# Patient Record
Sex: Male | Born: 1950
Health system: Southern US, Community
[De-identification: ages and names within clinical notes are randomized; demographics above are authoritative.]

## PROBLEM LIST (undated history)

## (undated) DIAGNOSIS — C439 Malignant melanoma of skin, unspecified: Secondary | ICD-10-CM

## (undated) DIAGNOSIS — Z8546 Personal history of malignant neoplasm of prostate: Secondary | ICD-10-CM

## (undated) DIAGNOSIS — Z9189 Other specified personal risk factors, not elsewhere classified: Secondary | ICD-10-CM

## (undated) DIAGNOSIS — C801 Malignant (primary) neoplasm, unspecified: Secondary | ICD-10-CM

## (undated) DIAGNOSIS — Z87442 Personal history of urinary calculi: Secondary | ICD-10-CM

## (undated) DIAGNOSIS — Z9889 Other specified postprocedural states: Secondary | ICD-10-CM

## (undated) DIAGNOSIS — Z85820 Personal history of malignant melanoma of skin: Secondary | ICD-10-CM

## (undated) DIAGNOSIS — E559 Vitamin D deficiency, unspecified: Secondary | ICD-10-CM

## (undated) DIAGNOSIS — Z973 Presence of spectacles and contact lenses: Secondary | ICD-10-CM

## (undated) DIAGNOSIS — I10 Essential (primary) hypertension: Secondary | ICD-10-CM

## (undated) DIAGNOSIS — N201 Calculus of ureter: Secondary | ICD-10-CM

---

## 1979-08-10 HISTORY — PX: WRIST GANGLION EXCISION: SUR520

## 1999-12-31 ENCOUNTER — Encounter: Admission: RE | Admit: 1999-12-31 | Discharge: 1999-12-31 | Payer: Self-pay | Admitting: Family Medicine

## 1999-12-31 ENCOUNTER — Encounter: Payer: Self-pay | Admitting: Family Medicine

## 2001-01-27 ENCOUNTER — Encounter: Payer: Self-pay | Admitting: Family Medicine

## 2001-01-27 ENCOUNTER — Encounter: Admission: RE | Admit: 2001-01-27 | Discharge: 2001-01-27 | Payer: Self-pay | Admitting: Family Medicine

## 2002-04-26 ENCOUNTER — Ambulatory Visit (HOSPITAL_COMMUNITY): Admission: RE | Admit: 2002-04-26 | Discharge: 2002-04-26 | Payer: Self-pay | Admitting: *Deleted

## 2003-02-01 ENCOUNTER — Encounter: Payer: Self-pay | Admitting: Family Medicine

## 2003-02-01 ENCOUNTER — Encounter: Admission: RE | Admit: 2003-02-01 | Discharge: 2003-02-01 | Payer: Self-pay | Admitting: Family Medicine

## 2008-02-23 ENCOUNTER — Encounter: Admission: RE | Admit: 2008-02-23 | Discharge: 2008-02-23 | Payer: Self-pay | Admitting: Family Medicine

## 2009-07-09 HISTORY — PX: ROBOT ASSISTED LAPAROSCOPIC RADICAL PROSTATECTOMY: SHX5141

## 2009-07-10 ENCOUNTER — Encounter (INDEPENDENT_AMBULATORY_CARE_PROVIDER_SITE_OTHER): Payer: Self-pay | Admitting: Urology

## 2009-07-10 ENCOUNTER — Inpatient Hospital Stay (HOSPITAL_COMMUNITY): Admission: RE | Admit: 2009-07-10 | Discharge: 2009-07-11 | Payer: Self-pay | Admitting: Urology

## 2010-03-19 ENCOUNTER — Encounter: Admission: RE | Admit: 2010-03-19 | Discharge: 2010-03-19 | Payer: Self-pay | Admitting: Family Medicine

## 2011-03-16 LAB — ABO/RH: ABO/RH(D): O NEG

## 2011-03-16 LAB — HEMOGLOBIN AND HEMATOCRIT, BLOOD
HCT: 45 % (ref 39.0–52.0)
Hemoglobin: 13.4 g/dL (ref 13.0–17.0)

## 2011-03-16 LAB — TYPE AND SCREEN
ABO/RH(D): O NEG
Antibody Screen: NEGATIVE

## 2011-03-17 LAB — COMPREHENSIVE METABOLIC PANEL
ALT: 29 U/L (ref 0–53)
AST: 31 U/L (ref 0–37)
Albumin: 4.3 g/dL (ref 3.5–5.2)
CO2: 28 mEq/L (ref 19–32)
Calcium: 9.5 mg/dL (ref 8.4–10.5)
Chloride: 106 mEq/L (ref 96–112)
GFR calc Af Amer: >60 mL/min (ref >60)
GFR calc non Af Amer: >60 mL/min (ref >60)
Sodium: 141 mEq/L (ref 135–145)

## 2011-03-17 LAB — CBC
MCHC: 34.5 g/dL (ref 30.0–36.0)
RBC: 5.01 MIL/uL (ref 4.22–5.81)
WBC: 4.9 10*3/uL (ref 4.0–10.5)

## 2011-04-23 NOTE — Op Note (Signed)
NAMESABIAN, Lawrence Finley               ACCOUNT NO.:  [id number]   MEDICAL RECORD NO.:  [id number]          PATIENT TYPE:  INP   LOCATION:  0002                         FACILITY:  [facility]   PHYSICIAN:  Valetta Fuller, M.D.  DATE OF BIRTH:  [birth year]   DATE OF PROCEDURE:  DATE OF DISCHARGE:                               OPERATIVE REPORT   PREOPERATIVE DIAGNOSIS:  Favorable clinical stage T1c adenocarcinoma of  the prostate.   POSTOPERATIVE DIAGNOSIS:  Favorable clinical stage T1c adenocarcinoma of  the prostate.   PROCEDURE PERFORMED:  Robotic-assisted laparoscopic radical retropubic  prostatectomy.   SURGEON:  Valetta Fuller, MD   ASSISTANT:  Delia Chimes, NP.   ANESTHESIA:  General endotracheal.   INDICATIONS:  Lawrence Finley is 60 years of age.  He was diagnosed by Dr.  Su Grand with favorable clinical stage T1c disease.  A moderate-volume  prostate without worrisome features.  Three out of 12 cores positive for  what appeared to be relatively low-volume Gleason 6 cancer involving the  left lobe of the prostate.  The patient underwent extensive consultation  with Dr. Brunilda Payor as well as myself with regard to treatment options and  elected to proceed with a surgical approach.  He chose robotic  laparoscopic prostatectomy.  He appeared to understand the advantages  and disadvantages of this approach.  We specifically discussed at length  issues with regard to incontinence and erectile dysfunction.  Full  informed consent has been obtained.  The patient went to preoperative  classes and physical therapy consultation.  He has done a mechanical  bowel prep.  Compression boots were placed preoperatively and the  patient received perioperative antibiotics.   TECHNIQUE AND FINDINGS:  The patient was brought to the operating room.  He was carefully placed in a lithotomy position, all extremities  carefully padded.  He had successful induction of general endotracheal  anesthesia.  He was  carefully secured to the operative table and then  placed in a steep Trendelenburg position.  He was then prepped and  draped in the usual manner.  A Foley catheter was inserted sterilely on  the field.   Initial camera port incision was chosen 18 cm above the pubic symphysis.  This was just to the left of the midline.  An open standard Hasson  technique was performed.  No adhesions were appreciated and abdominal  insufflation occurred without incident.  All other trocars placed with  direct visual guidance.  This included a 12-mm and 5-mm assist port and  three 8-mm robotic trocars.  Once all trocars were positioned, the  surgical cart was docked.  The bladder was filled to allow for easier  dissection in the space of Retzius.  The retropubic space was then  developed with a combination of electrocautery scissors and blunt  dissective technique.  Superficial fat off the endopelvic fascia and  bladder neck region was then taken down with electrocautery scissors.  The endopelvic fascia was opened from base to apex.  The levator  musculature was swept off the apex of the prostate isolating the dorsal  venous complex, which  was then stapled with the ETS stapler device.  Anterior bladder neck was identified with the aid of the Foley balloon  and then transected with electrocautery scissors.  Foley catheter was  found in the midline and retracted anteriorly.  Indigo carmine was given  but we were well away from the trigone and ureteral orifices.  Posterior  bladder neck was then transected.  Posteriorly the vas deferens and  seminal vesicles were individually dissected with the tips clipped  bilaterally.  The plane between the prostate and the rectum was then  established primarily with blunt dissective technique.   Attention was then turned towards bilateral nerve-spare.  Superficial  fascia on the prostate anteriorly was incised and then swept posterior  laterally on both sides.  The  neurovascular bundle was then swept from  apex back to base of the prostate so this was well off the lateral  aspect of the prostate.  By placing a retractor underneath the pedicle  of the prostate, the pedicle was divided into several bundles and  clipped with a locking clip.  This was done bilaterally.  At that point  only the apex and some very minimal posterior flimsy tissue remained.  Anterior urethra was then transected.  Foley catheter was retracted.  The posterior urethra was then transected.  A small amount of  rectourethralis muscle was then transected and the prostate specimen was  removed from the pelvis.  A copious irrigation was performed.  Hemostasis remained good other than minimal oozing from around the  levator sidewalls.  Dorsal vein was completely hemostatic at that  juncture.   We did not feel that there was an indication for pelvic lymph node  dissection given the relatively low-volume Gleason 6 disease.  Denonvilliers fascia was reapproximated with a 2-0 Vicryl suture to  provide additional support for the bladder.  The posterior bladder neck  and then posterior stump of the urethra were then reapproximated with an  interrupted 2-0 Vicryl suture.  Once these structures were  reapproximated, the rest of the anastomosis was done with a double-armed  3-0 Monocryl suture in a 360 running manner.  A new Coude catheter was  placed without difficulty and irrigation revealed the anastomosis to be  watertight.  We did place some FloSeal along the area around the bladder  neck and into the levator fossa to help with just a small amount of  oozing.  Blood loss at that point was in the 200-390 cc range.   A drain was placed through one of the 8-mm robotic trocars and placed in  the retropubic space and then secured to the skin.  The prostate  specimen was placed an Endopouch retrieval bag.  The 12-mm trocar site  was closed with a Vicryl suture with the aid of a suture  passer.  All  other trocars were taken down with direct visual guidance.  The camera  port incision was extended slightly to allow for removal of the prostate  specimen and that fascia was then closed with a running #1 Vicryl  suture.  All incisions were infiltrated and then closed with clips.  The  bladder irrigated a light-blue-colored urine.  No obvious complications  or problems occurred.  Again, estimated blood loss was in the 200-300 cc  range.  The patient was brought to the recovery room in stable  condition.      Valetta Fuller, M.D.  Electronically Signed     DSG/MEDQ  D:  07/10/2009  T:  07/10/2009  Job:  [id number]

## 2012-02-10 ENCOUNTER — Other Ambulatory Visit: Payer: Self-pay | Admitting: Family Medicine

## 2012-03-24 ENCOUNTER — Ambulatory Visit
Admission: RE | Admit: 2012-03-24 | Discharge: 2012-03-24 | Disposition: A | Discharge status: Home / Self Care | Payer: 59 | Source: Ambulatory Visit | Attending: Family Medicine | Admitting: Family Medicine

## 2012-03-24 DIAGNOSIS — M8580 Other specified disorders of bone density and structure, unspecified site: Secondary | ICD-10-CM

## 2012-04-09 ENCOUNTER — Other Ambulatory Visit: Payer: Self-pay | Admitting: Gastroenterology

## 2014-09-07 ENCOUNTER — Other Ambulatory Visit: Payer: Self-pay | Admitting: Family Medicine

## 2014-09-07 ENCOUNTER — Ambulatory Visit
Admission: RE | Admit: 2014-09-07 | Discharge: 2014-09-07 | Disposition: A | Discharge status: Home / Self Care | Payer: 59 | Source: Ambulatory Visit | Attending: Family Medicine | Admitting: Family Medicine

## 2014-09-07 DIAGNOSIS — N5082 Scrotal pain: Secondary | ICD-10-CM

## 2014-09-07 DIAGNOSIS — R319 Hematuria, unspecified: Secondary | ICD-10-CM

## 2014-09-07 DIAGNOSIS — R1031 Right lower quadrant pain: Secondary | ICD-10-CM

## 2014-10-07 ENCOUNTER — Other Ambulatory Visit: Payer: Self-pay | Admitting: Urology

## 2014-10-07 ENCOUNTER — Encounter (HOSPITAL_COMMUNITY): Payer: Self-pay | Admitting: Pharmacy Technician

## 2014-10-09 DIAGNOSIS — Z87442 Personal history of urinary calculi: Secondary | ICD-10-CM

## 2014-10-09 HISTORY — DX: Personal history of urinary calculi: Z87.442

## 2014-10-11 ENCOUNTER — Encounter (HOSPITAL_COMMUNITY): Payer: Self-pay | Admitting: *Deleted

## 2014-10-16 NOTE — H&P (Addendum)
Reason For Visit Seen today as a referral from Dr. Oneida Arenas for evaluation of gross hematuria and 6 X 9 mm (R) UPJ calculus.   Active Problems Problems  1. Calculus of right ureter (N20.1)   Assessed By: Jimmey Ralph (Urology); Last Assessed: 05 Oct 2014 2. Gross hematuria (R31.0)   Assessed By: Jimmey Ralph (Urology); Last Assessed: 05 Oct 2014  History of Present Illness 63 YO male patient of Dr. Cy Blamer seen today as a referral from Dr. Oneida Arenas for evaluation of gross hematuria and 6 X 9 mm (R) UPJ calculus.     GU Hx:  5 years status post his robotic prostatectomy( 07/2009). He had very favorable pathology with low-volume Gleason 6 cancer and negative margins. Initial PSA and subsequent PSAs have all continued to be undetectable. His incontinence has resolved ( 99%). He has no other voiding issues and seems to be satisfied with this progress in that area. He did have normal preoperative erectile functioning.      He is able to have intercourse without any devices or oral agents but erections are not 100%. No SUI. Oral agents cause headaches.  Erections without meds are 80-90%.  Erections continue to improve.    Oct 2015 Interval Hx:   Today continues to have painless intermittent gross hematuria. Denies passing stone. Was not started on alpha blocker.   Past Medical History Problems  1. History of hypertension (Z86.79) 2. History of Malignant Melanoma Of The Skin  Surgical History Problems  1. History of Prostatectomy Robotic-Assisted 2. History of Surgery Of Male Genitalia Vasectomy 3. History of Wrist Excision Of Ganglion  Current Meds 1. AmLODIPine Besylate 10 MG Oral Tablet; Take 1 tablet daily;  Therapy: (Recorded:08Mar2012) to Recorded 2. Aspirin 81 MG Oral Tablet; 1 per day;  Therapy: (Recorded:08Mar2012) to Recorded 3. Vitamin D TABS; 50,000mg ; 1 per week;  Therapy: (Recorded:08Mar2012) to Recorded  Allergies No Known Allergies  1. No Known  Allergies  Family History Problems  1. Family history of Family Health Status - Father's Age : Mother   77 2. Family history of Family Health Status - Mother's Age : Mother   93 3. Family history of Family Health Status Number Of Children : Mother   2 sons 4. Family history of Heart Disease : Father 5. Family history of Prostate Cancer  Social History Problems  1. Activities Of Daily Living 2. Alcohol Use   2 per wk 3. Caffeine Use   tea 2 per day 4. Exercise Habits   Yoga on a daily basis 5. Living Independently With Spouse 6. Marital History - Currently Married 7. Never A Smoker 8. Self-reliant In Usual Daily Activities  Review of Systems Genitourinary, constitutional, skin, eye, otolaryngeal, hematologic/lymphatic, cardiovascular, pulmonary, endocrine, musculoskeletal, gastrointestinal, neurological and psychiatric system(s) were reviewed and pertinent findings if present are noted.  Genitourinary: hematuria.    Vitals Vital Signs [Data Includes: Last 1 Day]  Recorded: 28Oct2015 09:55AM  Height: 6 ft  Weight: 210 lb  BMI Calculated: 28.48 BSA Calculated: 2.18 Blood Pressure: 135 / 80 Temperature: 98.4 F Heart Rate: 63  Physical Exam Constitutional: Well nourished and well developed . The patient appears well hydrated.  Abdomen: The abdomen is flat. The abdomen is soft and nontender. No suprapubic tenderness. No CVA tenderness.  Skin: Normal skin turgor and normal skin color and pigmentation.  Neuro/Psych:. Mood and affect are appropriate.    Results/Data Urine [Data Includes: Last 1 Day]   28Oct2015  COLOR AMBER   APPEARANCE  CLOUDY   SPECIFIC GRAVITY 1.025   pH 5.5   GLUCOSE NEG mg/dL  BILIRUBIN SMALL   KETONE NEG mg/dL  BLOOD LARGE   PROTEIN 30 mg/dL  UROBILINOGEN 0.2 mg/dL  NITRITE NEG   LEUKOCYTE ESTERASE NEG   SQUAMOUS EPITHELIAL/HPF FEW   WBC 0-2 WBC/hpf  RBC TNTC RBC/hpf  BACTERIA FEW   CRYSTALS Calcium Oxalate crystals noted   CASTS  Hyaline casts noted   Other MUCUS NOTED    Old records or history reviewed: Reviewed OV notes and CT abd/pelvis w/o contrast.  The following images/tracing/specimen were independently visualized:  KUB: clearly shows large right UPJ calculus.  The following clinical lab reports were reviewed:  UA- significant microscopic hematuria RBC: TNTC.    Assessment Assessed  1. Calculus of right ureter (N20.1) 2. Gross hematuria (R31.0)  Plan  Health Maintenance  1. UA With REFLEX; [Do Not Release]; Status:Complete;   Done: 28Oct2015 09:49AM  Given strainer. Instructed to strain urine. If captured bring to office  Has hydrocodone on hand  Begin Tamsulosin 0.4 mg 1 po daily #30/0RF  Will have Dr. Risa Grill call pt to discuss treatment option of either (R) EWSL vs cystourethroscopy, R RPG, stone extraction         KUB; Status:Resulted - Requires Verification;  Done: [id number] 12:00AM  Due:30Oct2015; Marked Important;Ordered; Today;   TJQ:ZESPQZRA of right ureter, Gross hematuria; Ordered QT:MAUQJF, Diane;   Discussion/Summary cc: Dr. Oneida Arenas   Signatures Electronically signed by : Jimmey Ralph, Trey Paula; Oct 05 2014 10:45AM EST  I reviewed Mr. Moch images and recent notes. I contacted him by phone and discussed his situation. We collectively elected to proceed with ESWL. Risks benefits of that procedure discussed with him.

## 2014-10-17 ENCOUNTER — Ambulatory Visit (HOSPITAL_COMMUNITY): Payer: 59

## 2014-10-17 ENCOUNTER — Encounter (HOSPITAL_COMMUNITY): Payer: Self-pay | Admitting: *Deleted

## 2014-10-17 ENCOUNTER — Ambulatory Visit (HOSPITAL_COMMUNITY)
Admission: RE | Admit: 2014-10-17 | Discharge: 2014-10-17 | Disposition: A | Discharge status: Home / Self Care | Payer: 59 | Source: Ambulatory Visit | Attending: Urology | Admitting: Urology

## 2014-10-17 ENCOUNTER — Encounter (HOSPITAL_COMMUNITY)
Admission: RE | Disposition: A | Discharge status: Home / Self Care | Payer: Self-pay | Source: Ambulatory Visit | Attending: Urology

## 2014-10-17 DIAGNOSIS — N201 Calculus of ureter: Secondary | ICD-10-CM

## 2014-10-17 DIAGNOSIS — I10 Essential (primary) hypertension: Secondary | ICD-10-CM | POA: Insufficient documentation

## 2014-10-17 DIAGNOSIS — Z8546 Personal history of malignant neoplasm of prostate: Secondary | ICD-10-CM | POA: Diagnosis not present

## 2014-10-17 DIAGNOSIS — Z85820 Personal history of malignant melanoma of skin: Secondary | ICD-10-CM | POA: Insufficient documentation

## 2014-10-17 DIAGNOSIS — Z7982 Long term (current) use of aspirin: Secondary | ICD-10-CM | POA: Diagnosis not present

## 2014-10-17 HISTORY — PX: EXTRACORPOREAL SHOCK WAVE LITHOTRIPSY: SHX1557

## 2014-10-17 HISTORY — DX: Personal history of urinary calculi: Z87.442

## 2014-10-17 HISTORY — DX: Essential (primary) hypertension: I10

## 2014-10-17 SURGERY — LITHOTRIPSY, ESWL
Anesthesia: LOCAL | Laterality: Right

## 2014-10-17 MED ORDER — DIAZEPAM 5 MG PO TABS
10.0000 mg | ORAL_TABLET | ORAL | Status: AC
  Administered 2014-10-17: 10 mg via ORAL
  Filled 2014-10-17: qty 2

## 2014-10-17 MED ORDER — CIPROFLOXACIN HCL 500 MG PO TABS
500.0000 mg | ORAL_TABLET | ORAL | Status: AC
  Administered 2014-10-17: 500 mg via ORAL
  Filled 2014-10-17: qty 1

## 2014-10-17 MED ORDER — DIPHENHYDRAMINE HCL 25 MG PO CAPS
25.0000 mg | ORAL_CAPSULE | ORAL | Status: AC
  Administered 2014-10-17: 25 mg via ORAL
  Filled 2014-10-17: qty 1

## 2014-10-17 MED ORDER — DEXTROSE-NACL 5-0.45 % IV SOLN
INTRAVENOUS | Status: DC
  Administered 2014-10-17: 09:00:00 via INTRAVENOUS

## 2014-10-17 NOTE — Interval H&P Note (Signed)
History and Physical Interval Note:  10/17/2014 10:10 AM  Lawrence Finley  has presented today for surgery, with the diagnosis of Right Ureteral Pelvic Junction Stone  The various methods of treatment have been discussed with the patient and family. After consideration of risks, benefits and other options for treatment, the patient has consented to  Procedure(s): RIGHT EXTRACORPOREAL SHOCK WAVE LITHOTRIPSY (ESWL) (Right) as a surgical intervention .  The patient's history has been reviewed, patient examined, no change in status, stable for surgery.  I have reviewed the patient's chart and labs.  Questions were answered to the patient's satisfaction.     Ladan Vanderzanden S

## 2014-10-17 NOTE — Discharge Instructions (Signed)
See [facility] discharge instructions in chart.  

## 2014-10-17 NOTE — Op Note (Signed)
See [facility] OP note scanned into chart. 

## 2014-12-19 ENCOUNTER — Other Ambulatory Visit: Payer: Self-pay | Admitting: Urology

## 2014-12-21 ENCOUNTER — Encounter (HOSPITAL_BASED_OUTPATIENT_CLINIC_OR_DEPARTMENT_OTHER): Payer: Self-pay | Admitting: *Deleted

## 2014-12-21 NOTE — Progress Notes (Signed)
   12/21/14 1557  OBSTRUCTIVE SLEEP APNEA  Have you ever been diagnosed with sleep apnea through a sleep study? No  Do you snore loudly (loud enough to be heard through closed doors)?  1  Do you often feel tired, fatigued, or sleepy during the daytime? 0  Has anyone observed you stop breathing during your sleep? 1  Do you have, or are you being treated for high blood pressure? 1  BMI more than 35 kg/m2? 0  Age over 64 years old? 1  Neck circumference greater than 40 cm/16 inches? 1  Gender: 1  Obstructive Sleep Apnea Score 6  Score 4 or greater  Results sent to PCP

## 2014-12-21 NOTE — Progress Notes (Signed)
SPOKE W/ PT WIFE. NPO AFTER MN. ARRIVE AT 0600. NEEDS ISTAT AND EKG.

## 2014-12-23 NOTE — H&P (Signed)
Reason For Visit   [name] presents today for additional follow-up status post his ESWL. Prostate 2 months ago he underwent treatment for a dense fairly large stone in his right ureteropelvic junction. The stone fragmented but poorly. He passed 4-6 fragments but on follow-up KUB on month ago he had what appeared to 2 fairly large fragments within the proximal ureter on the right. Both these fragments were about 5 mm in size. He has not had much in the way of discomfort since that time. He has not had any gross hematuria. He has had no fever or chills. He has no new complaints today. He is here for further assessment of his current stone status.    A limited right renal ultrasonography there is no gross evidence of hydronephrosis in the right kidney. There is no other pathology appreciated. No evidence of renal hematoma. No obvious solid right renal mass. On KUB imaging the patient continues to have evidence of 2-3 fragments within the proximal to mid ureter on the right. There is been slight progression distally but no more than a centimeter or 2. Fragments continue to be about 5 mm in size. Urinalysis today shows nothing of concern. From a prostate cancer standpoint he continues to do well. His last undetectable PSA was approximately 6 months ago.       History of Present Illness            Past Gu Hx:  Mr. Lawrence Finley is now 5 years status post his robotic prostatectomy( 07/2009). He had very favorable pathology with low-volume Gleason 6 cancer and negative margins. Initial PSA and subsequent PSAs have all continued to be undetectable. His incontinence has resolved ( 99%). He has no other voiding issues and seems to be satisfied with this progress in that area. He did have normal preoperative erectile functioning.      He is able to have intercourse without any devices or oral agents but erections are not 100%. No SUI. Oral agents cause headaches.  Erections without meds are 80-90%.   Erections continue to improve. He has no new complaints or concerns today   Past Medical History Problems  1. History of hypertension (Z86.79) 2. History of Malignant Melanoma Of The Skin  Surgical History Problems  1. History of Lithotripsy 2. History of Prostatectomy Robotic-Assisted 3. History of Surgery Of Male Genitalia Vasectomy 4. History of Wrist Excision Of Ganglion  Current Meds 1. AmLODIPine Besylate 10 MG Oral Tablet; Take 1 tablet daily;  Therapy: (Recorded:08Mar2012) to Recorded 2. Aspirin 81 MG Oral Tablet; 1 per day;  Therapy: (Recorded:08Mar2012) to Recorded 3. Hydrocodone-Acetaminophen TABS;  Therapy: (Recorded:28Oct2015) to Recorded 4. Tamsulosin HCl - 0.4 MG Oral Capsule; Take one (1) tablet(s) every night at bedtime;  Therapy: 28Oct2015 to (Evaluate:27Nov2016)  Requested for: [id number]; Last  Rx:03Dec2015 Ordered 5. Vitamin D TABS; 50,000mg ; 1 per week;  Therapy: (Recorded:08Mar2012) to Recorded  Allergies Medication  1. No Known Drug Allergies  Family History Problems  1. Family history of Family Health Status - Father's Age : Mother   63 2. Family history of Family Health Status - Mother's Age : Mother   53 3. Family history of Family Health Status Number Of Children : Mother   2 sons 4. Family history of Heart Disease : Father 5. Family history of Prostate Cancer  Social History Problems  1. Activities Of Daily Living 2. Alcohol Use   2 per wk 3. Caffeine Use   tea 2 per day 4. Exercise Habits  Yoga on a daily basis 5. Living Independently With Spouse 6. Marital History - Currently Married 7. Never A Smoker 8. Self-reliant In Usual Daily Activities  Review of Systems  Genitourinary: no incontinence, urine stream is not weak, no hematuria, no penile pain and no penile curvature.  Gastrointestinal: no flank pain and no abdominal pain.    Vitals Vital Signs [Data Includes: Last 1 Day]  Recorded: [id number] 03:01PM  Blood  Pressure: 142 / 83 Temperature: 97.5 F Heart Rate: 65  Physical Exam Constitutional: Well nourished and well developed . The patient appears well hydrated. Chest: Nl effort  Abdomen: The abdomen is flat. The abdomen is soft and nontender. No suprapubic tenderness. No CVA tenderness.  Skin: Normal skin turgor and normal skin color and pigmentation.  Neuro/Psych:. Mood and affect are appropriate. Results/Data Urine [Data Includes: Last 1 Day]   [id number] COLOR YELLOW  APPEARANCE CLEAR  SPECIFIC GRAVITY 1.030  pH 6.0  GLUCOSE NEG mg/dL BILIRUBIN NEG  KETONE TRACE mg/dL BLOOD TRACE  PROTEIN NEG mg/dL UROBILINOGEN 0.2 mg/dL NITRITE NEG  LEUKOCYTE ESTERASE NEG  SQUAMOUS EPITHELIAL/HPF RARE  WBC NONE SEEN WBC/hpf RBC 0-2 RBC/hpf BACTERIA RARE  CRYSTALS NONE SEEN  CASTS NONE SEEN  Other MUCUS   Assessment Assessed  1. Calculus of right ureter (N20.1)  Plan Calculus of right ureter  1. Follow-up Schedule Surgery Office  Follow-up  Status: Complete  Done: [id number] Health Maintenance  2. UA With REFLEX; [Do Not Release]; Status:Complete;   Done: [id number] 02:37PM  Discussion/Summary Lawrence Finley is now 2 months status post ESWL. He continues to have fragments within his right mid ureter. We talked about several options. This included ongoing observation versus repeat ESWL versus ureteroscopy. After discussion of the risks and benefits of all these approaches he has elected to proceed with ureteroscopy with holmium laser lithotripsy. Risk and benefits of procedure discussed with him in detail. This will be set up for sometime in the near future.   Signatures Electronically signed by : Rana Snare, M.D.; Dec 19 2014  5:29PM EST

## 2014-12-25 NOTE — Anesthesia Preprocedure Evaluation (Addendum)
Anesthesia Evaluation  Patient identified by MRN, date of birth, ID band Patient awake    Reviewed: Allergy & Precautions, H&P , NPO status , Patient's Chart, lab work & pertinent test results  Airway Mallampati: II  TM Distance: >3 FB Neck ROM: full    Dental no notable dental hx. (+) Teeth Intact, Dental Advisory Given   Pulmonary  Stop bang 6 breath sounds clear to auscultation  Pulmonary exam normal       Cardiovascular Exercise Tolerance: Good hypertension, Pt. on medications negative cardio ROS  Rhythm:regular Rate:Normal     Neuro/Psych negative neurological ROS  negative psych ROS   GI/Hepatic negative GI ROS, Neg liver ROS,   Endo/Other  negative endocrine ROS  Renal/GU negative Renal ROS  negative genitourinary   Musculoskeletal   Abdominal   Peds  Hematology negative hematology ROS (+)   Anesthesia Other Findings Prostate cancer  Reproductive/Obstetrics negative OB ROS                            Anesthesia Physical Anesthesia Plan  ASA: III  Anesthesia Plan: General   Post-op Pain Management:    Induction: Intravenous  Airway Management Planned: LMA  Additional Equipment:   Intra-op Plan:   Post-operative Plan:   Informed Consent: I have reviewed the patients History and Physical, chart, labs and discussed the procedure including the risks, benefits and alternatives for the proposed anesthesia with the patient or authorized representative who has indicated his/her understanding and acceptance.   Dental Advisory Given  Plan Discussed with: CRNA and Surgeon  Anesthesia Plan Comments:         Anesthesia Quick Evaluation

## 2014-12-26 ENCOUNTER — Ambulatory Visit (HOSPITAL_BASED_OUTPATIENT_CLINIC_OR_DEPARTMENT_OTHER): Payer: 59 | Admitting: Anesthesiology

## 2014-12-26 ENCOUNTER — Encounter (HOSPITAL_BASED_OUTPATIENT_CLINIC_OR_DEPARTMENT_OTHER)
Admission: RE | Disposition: A | Discharge status: Home / Self Care | Payer: Self-pay | Source: Ambulatory Visit | Attending: Urology

## 2014-12-26 ENCOUNTER — Ambulatory Visit (HOSPITAL_BASED_OUTPATIENT_CLINIC_OR_DEPARTMENT_OTHER)
Admission: RE | Admit: 2014-12-26 | Discharge: 2014-12-26 | Disposition: A | Discharge status: Home / Self Care | Payer: 59 | Source: Ambulatory Visit | Attending: Urology | Admitting: Urology

## 2014-12-26 ENCOUNTER — Encounter (HOSPITAL_BASED_OUTPATIENT_CLINIC_OR_DEPARTMENT_OTHER): Payer: Self-pay | Admitting: *Deleted

## 2014-12-26 ENCOUNTER — Other Ambulatory Visit: Payer: Self-pay

## 2014-12-26 DIAGNOSIS — I10 Essential (primary) hypertension: Secondary | ICD-10-CM | POA: Diagnosis not present

## 2014-12-26 DIAGNOSIS — Z7982 Long term (current) use of aspirin: Secondary | ICD-10-CM | POA: Diagnosis not present

## 2014-12-26 DIAGNOSIS — Z85820 Personal history of malignant melanoma of skin: Secondary | ICD-10-CM | POA: Insufficient documentation

## 2014-12-26 DIAGNOSIS — Z8546 Personal history of malignant neoplasm of prostate: Secondary | ICD-10-CM | POA: Insufficient documentation

## 2014-12-26 DIAGNOSIS — N201 Calculus of ureter: Secondary | ICD-10-CM | POA: Diagnosis present

## 2014-12-26 HISTORY — DX: Other specified postprocedural states: Z98.890

## 2014-12-26 HISTORY — DX: Vitamin D deficiency, unspecified: E55.9

## 2014-12-26 HISTORY — DX: Calculus of ureter: N20.1

## 2014-12-26 HISTORY — PX: CYSTOSCOPY WITH RETROGRADE PYELOGRAM, URETEROSCOPY AND STENT PLACEMENT: SHX5789

## 2014-12-26 HISTORY — DX: Other specified personal risk factors, not elsewhere classified: Z91.89

## 2014-12-26 HISTORY — DX: Personal history of malignant neoplasm of prostate: Z85.46

## 2014-12-26 HISTORY — DX: Other specified postprocedural states: Z85.820

## 2014-12-26 HISTORY — DX: Presence of spectacles and contact lenses: Z97.3

## 2014-12-26 HISTORY — PX: HOLMIUM LASER APPLICATION: SHX5852

## 2014-12-26 LAB — POCT I-STAT 4, (NA,K, GLUC, HGB,HCT)
GLUCOSE: 105 mg/dL — AB (ref 70–99)
HEMATOCRIT: 47 % (ref 39.0–52.0)
Hemoglobin: 16 g/dL (ref 13.0–17.0)
Potassium: 3.5 mmol/L (ref 3.5–5.1)
Sodium: 142 mmol/L (ref 135–145)

## 2014-12-26 SURGERY — CYSTOURETEROSCOPY, WITH RETROGRADE PYELOGRAM AND STENT INSERTION
Anesthesia: General | Site: Ureter | Laterality: Right

## 2014-12-26 MED ORDER — CEFAZOLIN SODIUM-DEXTROSE 2-3 GM-% IV SOLR
INTRAVENOUS | Status: AC
  Filled 2014-12-26: qty 50

## 2014-12-26 MED ORDER — DEXAMETHASONE SODIUM PHOSPHATE 4 MG/ML IJ SOLN
INTRAMUSCULAR | Status: DC | PRN
  Administered 2014-12-26 (×2): 10 mg via INTRAVENOUS

## 2014-12-26 MED ORDER — PROPOFOL 10 MG/ML IV BOLUS
INTRAVENOUS | Status: DC | PRN
  Administered 2014-12-26: 200 mg via INTRAVENOUS

## 2014-12-26 MED ORDER — CEFAZOLIN SODIUM-DEXTROSE 2-3 GM-% IV SOLR
2.0000 g | INTRAVENOUS | Status: AC
  Administered 2014-12-26: 2 g via INTRAVENOUS
  Filled 2014-12-26: qty 50

## 2014-12-26 MED ORDER — ACETAMINOPHEN 10 MG/ML IV SOLN
INTRAVENOUS | Status: DC | PRN
  Administered 2014-12-26: 1000 mg via INTRAVENOUS

## 2014-12-26 MED ORDER — ONDANSETRON HCL 4 MG/2ML IJ SOLN
INTRAMUSCULAR | Status: DC | PRN
  Administered 2014-12-26: 4 mg via INTRAVENOUS

## 2014-12-26 MED ORDER — LACTATED RINGERS IV SOLN
INTRAVENOUS | Status: DC
  Administered 2014-12-26 (×2): via INTRAVENOUS
  Filled 2014-12-26: qty 1000

## 2014-12-26 MED ORDER — MIDAZOLAM HCL 2 MG/2ML IJ SOLN
INTRAMUSCULAR | Status: AC
  Filled 2014-12-26: qty 2

## 2014-12-26 MED ORDER — LIDOCAINE HCL (CARDIAC) 20 MG/ML IV SOLN
INTRAVENOUS | Status: DC | PRN
  Administered 2014-12-26: 100 mg via INTRAVENOUS

## 2014-12-26 MED ORDER — MIDAZOLAM HCL 5 MG/5ML IJ SOLN
INTRAMUSCULAR | Status: DC | PRN
  Administered 2014-12-26: 2 mg via INTRAVENOUS

## 2014-12-26 MED ORDER — BELLADONNA ALKALOIDS-OPIUM 16.2-60 MG RE SUPP
RECTAL | Status: AC
  Filled 2014-12-26: qty 1

## 2014-12-26 MED ORDER — BELLADONNA ALKALOIDS-OPIUM 16.2-60 MG RE SUPP
RECTAL | Status: DC | PRN
  Administered 2014-12-26: 1 via RECTAL

## 2014-12-26 MED ORDER — IOHEXOL 350 MG/ML SOLN
INTRAVENOUS | Status: DC | PRN
  Administered 2014-12-26: 2 mL

## 2014-12-26 MED ORDER — FENTANYL CITRATE 0.05 MG/ML IJ SOLN
INTRAMUSCULAR | Status: DC | PRN
  Administered 2014-12-26 (×2): 25 ug via INTRAVENOUS
  Administered 2014-12-26 (×2): 50 ug via INTRAVENOUS

## 2014-12-26 MED ORDER — EPHEDRINE SULFATE 50 MG/ML IJ SOLN
INTRAMUSCULAR | Status: DC | PRN
  Administered 2014-12-26 (×2): 10 mg via INTRAVENOUS

## 2014-12-26 MED ORDER — LIDOCAINE HCL 2 % EX GEL
CUTANEOUS | Status: DC | PRN
  Administered 2014-12-26: 1 via URETHRAL

## 2014-12-26 MED ORDER — FENTANYL CITRATE 0.05 MG/ML IJ SOLN
INTRAMUSCULAR | Status: AC
  Filled 2014-12-26: qty 4

## 2014-12-26 MED ORDER — URIBEL 118 MG PO CAPS: 1.0000 | ORAL_CAPSULE | Freq: Three times a day (TID) | ORAL | Status: DC | PRN

## 2014-12-26 MED ORDER — SODIUM CHLORIDE 0.9 % IR SOLN
Status: DC | PRN
  Administered 2014-12-26: 4000 mL via INTRAVESICAL

## 2014-12-26 MED ORDER — KETOROLAC TROMETHAMINE 30 MG/ML IJ SOLN
INTRAMUSCULAR | Status: DC | PRN
  Administered 2014-12-26: 30 mg via INTRAVENOUS

## 2014-12-26 SURGICAL SUPPLY — 39 items
ADAPTER CATH URET PLST 4-6FR (CATHETERS) IMPLANT
BAG DRAIN URO-CYSTO SKYTR STRL (DRAIN) ×2 IMPLANT
BASKET LASER NITINOL 1.9FR (BASKET) IMPLANT
BASKET STNLS GEMINI 4WIRE 3FR (BASKET) IMPLANT
BASKET ZERO TIP NITINOL 2.4FR (BASKET) ×2 IMPLANT
BENZOIN TINCTURE PRP APPL 2/3 (GAUZE/BANDAGES/DRESSINGS) IMPLANT
CANISTER SUCT LVC 12 LTR MEDI- (MISCELLANEOUS) ×2 IMPLANT
CATH INTERMIT  6FR 70CM (CATHETERS) IMPLANT
CATH URET 5FR 28IN CONE TIP (BALLOONS)
CATH URET 5FR 28IN OPEN ENDED (CATHETERS) IMPLANT
CATH URET 5FR 70CM CONE TIP (BALLOONS) IMPLANT
CLOTH BEACON ORANGE TIMEOUT ST (SAFETY) ×2 IMPLANT
DRAPE CAMERA CLOSED 9X96 (DRAPES) ×2 IMPLANT
DRSG TEGADERM 2-3/8X2-3/4 SM (GAUZE/BANDAGES/DRESSINGS) IMPLANT
FIBER LASER FLEXIVA 1000 (UROLOGICAL SUPPLIES) IMPLANT
FIBER LASER FLEXIVA 200 (UROLOGICAL SUPPLIES) IMPLANT
FIBER LASER FLEXIVA 365 (UROLOGICAL SUPPLIES) ×2 IMPLANT
FIBER LASER FLEXIVA 550 (UROLOGICAL SUPPLIES) IMPLANT
GLOVE BIO SURGEON STRL SZ 6.5 (GLOVE) ×2 IMPLANT
GLOVE BIO SURGEON STRL SZ7.5 (GLOVE) ×2 IMPLANT
GLOVE BIOGEL PI IND STRL 6.5 (GLOVE) ×2 IMPLANT
GLOVE BIOGEL PI INDICATOR 6.5 (GLOVE) ×2
GOWN STRL REIN XL XLG (GOWN DISPOSABLE) IMPLANT
GOWN STRL REUS W/TWL LRG LVL3 (GOWN DISPOSABLE) ×2 IMPLANT
GOWN STRL REUS W/TWL XL LVL3 (GOWN DISPOSABLE) ×2 IMPLANT
GUIDEWIRE 0.038 PTFE COATED (WIRE) IMPLANT
GUIDEWIRE ANG ZIPWIRE 038X150 (WIRE) IMPLANT
GUIDEWIRE STR DUAL SENSOR (WIRE) IMPLANT
IV NS 1000ML (IV SOLUTION) ×1
IV NS 1000ML BAXH (IV SOLUTION) ×1 IMPLANT
IV NS IRRIG 3000ML ARTHROMATIC (IV SOLUTION) ×2 IMPLANT
KIT BALLIN UROMAX 15FX10 (LABEL) IMPLANT
KIT BALLN UROMAX 15FX4 (MISCELLANEOUS) IMPLANT
KIT BALLN UROMAX 26 75X4 (MISCELLANEOUS)
NS IRRIG 500ML POUR BTL (IV SOLUTION) IMPLANT
PACK CYSTO (CUSTOM PROCEDURE TRAY) ×2 IMPLANT
SET HIGH PRES BAL DIL (LABEL)
SHEATH ACCESS URETERAL 38CM (SHEATH) IMPLANT
STENT URET 6FRX26 CONTOUR (STENTS) ×2 IMPLANT

## 2014-12-26 NOTE — Discharge Instructions (Addendum)

## 2014-12-26 NOTE — Op Note (Signed)
Preoperative diagnosis: right ureteral calculi  Postoperative diagnosis: right ureteral calculi  Procedure:  1. Cystoscopy 2. right ureteroscopy and stone removal 3. Ureteroscopic laser lithotripsy 4. right ureteral stent placement (26cm) 73F with string 5. right retrograde pyelography with interpretation  Surgeon: Bernestine Amass, MD  Anesthesia: General  Complications: None  Intraoperative findings: right retrograde pyelography demonstrated  filling defects within the right ureter consistent with the patient's known calculuiwithout other abnormalities.  EBL: Minimal  Specimens: 1. right ureteral calculi  Disposition of specimens: [facility] for stone analysis  Indication: Lawrence Finley is a 64 y.o.   patient with urolithiasis. After reviewing the management options for treatment, the patient elected to proceed with the above surgical procedure(s). We have discussed the potential benefits and risks of the procedure, side effects of the proposed treatment, the likelihood of the patient achieving the goals of the procedure, and any potential problems that might occur during the procedure or recuperation. Informed consent has been obtained.  Description of procedure:  The patient was taken to the operating room and general anesthesia was induced.  The patient was placed in the dorsal lithotomy position, prepped and draped in the usual sterile fashion, and preoperative antibiotics were administered. A preoperative time-out was performed.   Cystourethroscopy was performed.  The patient's urethra was examined and was normal. Prostatic urethra absent status post prostatectomy. The bladder was then systematically examined in its entirety. There was no evidence for any bladder tumors, stones, or other mucosal pathology.    Attention then turned to the right ureteral orifice and a ureteral catheter was used to intubate the ureteral orifice.  Omnipaque contrast was  injected through the ureteral catheter and a retrograde pyelogram was performed with findings as dictated above.  A 0.38 sensor guidewire was then advanced up the right ureter into the renal pelvis under fluoroscopic guidance. The 6 Fr semirigid ureteroscope was then advanced into the ureter next to the guidewire and the calculus was identified.   The stone was then fragmented with the 365 micron holmium laser fiber on a setting of 0.8J and frequency of 5 Hz.   All stones were then removed from the ureter with a zero tip nitinol basket.  Reinspection of the ureter revealed no remaining visible stones or fragments.   The wire was then backloaded through the cystoscope and a ureteral stent was advance over the wire using Seldinger technique.  The stent was positioned appropriately under fluoroscopic and cystoscopic guidance.  The wire was then removed with an adequate stent curl noted in the renal pelvis as well as in the bladder.  The bladder was then emptied and the procedure ended.  The patient appeared to tolerate the procedure well and without complications.  The patient was able to be awakened and transferred to the recovery unit in satisfactory condition.

## 2014-12-26 NOTE — Anesthesia Postprocedure Evaluation (Signed)
  Anesthesia Post-op Note  Patient: Lawrence Finley  Procedure(s) Performed: Procedure(s) (LRB): CYSTOSCOPY WITH RETROGRADE PYELOGRAM, URETEROSCOPY, STONE EXTRACTION,  AND STENT PLACEMENT (Right) HOLMIUM LASER APPLICATION (Right)  Patient Location: PACU  Anesthesia Type: General  Level of Consciousness: awake and alert   Airway and Oxygen Therapy: Patient Spontanous Breathing  Post-op Pain: mild  Post-op Assessment: Post-op Vital signs reviewed, Patient's Cardiovascular Status Stable, Respiratory Function Stable, Patent Airway and No signs of Nausea or vomiting  Last Vitals:  Filed Vitals:   12/26/14 0845  BP: 105/51  Pulse: 58  Temp:   Resp: 9    Post-op Vital Signs: stable   Complications: No apparent anesthesia complications

## 2014-12-26 NOTE — Transfer of Care (Signed)
Immediate Anesthesia Transfer of Care Note  Patient: Lawrence Finley  Procedure(s) Performed: Procedure(s) (LRB): CYSTOSCOPY WITH RETROGRADE PYELOGRAM, URETEROSCOPY, STONE EXTRACTION,  AND STENT PLACEMENT (Right) HOLMIUM LASER APPLICATION (Right)  Patient Location: PACU  Anesthesia Type: General  Level of Consciousness: awake, alert  and oriented  Airway & Oxygen Therapy: Patient Spontanous Breathing and Patient connected to face mask oxygen  Post-op Assessment: Report given to PACU RN and Post -op Vital signs reviewed and stable  Post vital signs: Reviewed and stable  Complications: No apparent anesthesia complications

## 2014-12-26 NOTE — Interval H&P Note (Signed)
History and Physical Interval Note:  12/26/2014 7:27 AM  Lawrence Finley  has presented today for surgery, with the diagnosis of RIGHT URETERAL STONE FRAGMENTS  The various methods of treatment have been discussed with the patient and family. After consideration of risks, benefits and other options for treatment, the patient has consented to  Procedure(s): CYSTOSCOPY WITH RETROGRADE PYELOGRAM, URETEROSCOPY AND STENT PLACEMENT (Right) HOLMIUM LASER APPLICATION (Right) as a surgical intervention .  The patient's history has been reviewed, patient examined, no change in status, stable for surgery.  I have reviewed the patient's chart and labs.  Questions were answered to the patient's satisfaction.     Tykeshia Tourangeau S

## 2014-12-26 NOTE — Anesthesia Procedure Notes (Signed)
Procedure Name: LMA Insertion Date/Time: 12/26/2014 7:36 AM Performed by: Mechele Claude Pre-anesthesia Checklist: Patient identified, Emergency Drugs available, Suction available and Patient being monitored Patient Re-evaluated:Patient Re-evaluated prior to inductionOxygen Delivery Method: Circle System Utilized Preoxygenation: Pre-oxygenation with 100% oxygen Intubation Type: IV induction Ventilation: Mask ventilation without difficulty LMA: LMA inserted LMA Size: 5.0 Number of attempts: 1 Airway Equipment and Method: bite block Placement Confirmation: positive ETCO2 Tube secured with: Tape Dental Injury: Teeth and Oropharynx as per pre-operative assessment

## 2014-12-27 ENCOUNTER — Encounter (HOSPITAL_BASED_OUTPATIENT_CLINIC_OR_DEPARTMENT_OTHER): Payer: Self-pay | Admitting: Urology

## 2017-02-27 DIAGNOSIS — T753XXA Motion sickness, initial encounter: Secondary | ICD-10-CM | POA: Diagnosis not present

## 2017-02-27 DIAGNOSIS — I10 Essential (primary) hypertension: Secondary | ICD-10-CM | POA: Diagnosis not present

## 2017-02-27 DIAGNOSIS — E559 Vitamin D deficiency, unspecified: Secondary | ICD-10-CM | POA: Diagnosis not present

## 2017-02-27 DIAGNOSIS — E7800 Pure hypercholesterolemia, unspecified: Secondary | ICD-10-CM | POA: Diagnosis not present

## 2017-02-27 DIAGNOSIS — M8580 Other specified disorders of bone density and structure, unspecified site: Secondary | ICD-10-CM | POA: Diagnosis not present

## 2017-03-28 DIAGNOSIS — I10 Essential (primary) hypertension: Secondary | ICD-10-CM | POA: Diagnosis not present

## 2017-05-08 DIAGNOSIS — H35433 Paving stone degeneration of retina, bilateral: Secondary | ICD-10-CM | POA: Diagnosis not present

## 2017-05-08 DIAGNOSIS — H40053 Ocular hypertension, bilateral: Secondary | ICD-10-CM | POA: Diagnosis not present

## 2017-05-13 DIAGNOSIS — H40053 Ocular hypertension, bilateral: Secondary | ICD-10-CM | POA: Diagnosis not present

## 2017-05-13 DIAGNOSIS — H40023 Open angle with borderline findings, high risk, bilateral: Secondary | ICD-10-CM | POA: Diagnosis not present

## 2017-07-03 DIAGNOSIS — Z6828 Body mass index (BMI) 28.0-28.9, adult: Secondary | ICD-10-CM | POA: Diagnosis not present

## 2017-07-03 DIAGNOSIS — Z7902 Long term (current) use of antithrombotics/antiplatelets: Secondary | ICD-10-CM | POA: Diagnosis not present

## 2017-07-03 DIAGNOSIS — I10 Essential (primary) hypertension: Secondary | ICD-10-CM | POA: Diagnosis not present

## 2017-07-03 DIAGNOSIS — E559 Vitamin D deficiency, unspecified: Secondary | ICD-10-CM | POA: Diagnosis not present

## 2017-07-03 DIAGNOSIS — Z0000 Encounter for general adult medical examination without abnormal findings: Secondary | ICD-10-CM | POA: Diagnosis not present

## 2017-09-28 DIAGNOSIS — S70361A Insect bite (nonvenomous), right thigh, initial encounter: Secondary | ICD-10-CM | POA: Diagnosis not present

## 2017-09-29 DIAGNOSIS — Z86010 Personal history of colonic polyps: Secondary | ICD-10-CM | POA: Diagnosis not present

## 2017-09-29 DIAGNOSIS — K640 First degree hemorrhoids: Secondary | ICD-10-CM | POA: Diagnosis not present

## 2017-09-29 DIAGNOSIS — D126 Benign neoplasm of colon, unspecified: Secondary | ICD-10-CM | POA: Diagnosis not present

## 2017-09-29 DIAGNOSIS — K5730 Diverticulosis of large intestine without perforation or abscess without bleeding: Secondary | ICD-10-CM | POA: Diagnosis not present

## 2017-10-01 DIAGNOSIS — R21 Rash and other nonspecific skin eruption: Secondary | ICD-10-CM | POA: Diagnosis not present

## 2017-10-01 DIAGNOSIS — I10 Essential (primary) hypertension: Secondary | ICD-10-CM | POA: Diagnosis not present

## 2017-10-01 DIAGNOSIS — E7800 Pure hypercholesterolemia, unspecified: Secondary | ICD-10-CM | POA: Diagnosis not present

## 2017-10-01 DIAGNOSIS — Z23 Encounter for immunization: Secondary | ICD-10-CM | POA: Diagnosis not present

## 2017-10-01 DIAGNOSIS — Z0000 Encounter for general adult medical examination without abnormal findings: Secondary | ICD-10-CM | POA: Diagnosis not present

## 2017-10-01 DIAGNOSIS — E559 Vitamin D deficiency, unspecified: Secondary | ICD-10-CM | POA: Diagnosis not present

## 2017-10-01 DIAGNOSIS — M8580 Other specified disorders of bone density and structure, unspecified site: Secondary | ICD-10-CM | POA: Diagnosis not present

## 2017-10-01 DIAGNOSIS — Z1159 Encounter for screening for other viral diseases: Secondary | ICD-10-CM | POA: Diagnosis not present

## 2017-10-02 DIAGNOSIS — D126 Benign neoplasm of colon, unspecified: Secondary | ICD-10-CM | POA: Diagnosis not present

## 2017-10-07 DIAGNOSIS — R69 Illness, unspecified: Secondary | ICD-10-CM | POA: Diagnosis not present

## 2017-10-27 DIAGNOSIS — Z8546 Personal history of malignant neoplasm of prostate: Secondary | ICD-10-CM | POA: Diagnosis not present

## 2018-01-27 DIAGNOSIS — H40053 Ocular hypertension, bilateral: Secondary | ICD-10-CM | POA: Diagnosis not present

## 2018-04-01 DIAGNOSIS — M8580 Other specified disorders of bone density and structure, unspecified site: Secondary | ICD-10-CM | POA: Diagnosis not present

## 2018-04-01 DIAGNOSIS — R7309 Other abnormal glucose: Secondary | ICD-10-CM | POA: Diagnosis not present

## 2018-04-01 DIAGNOSIS — I10 Essential (primary) hypertension: Secondary | ICD-10-CM | POA: Diagnosis not present

## 2018-04-01 DIAGNOSIS — E559 Vitamin D deficiency, unspecified: Secondary | ICD-10-CM | POA: Diagnosis not present

## 2018-04-01 DIAGNOSIS — M25511 Pain in right shoulder: Secondary | ICD-10-CM | POA: Diagnosis not present

## 2018-04-01 DIAGNOSIS — E7800 Pure hypercholesterolemia, unspecified: Secondary | ICD-10-CM | POA: Diagnosis not present

## 2018-04-01 DIAGNOSIS — L601 Onycholysis: Secondary | ICD-10-CM | POA: Diagnosis not present

## 2018-04-07 DIAGNOSIS — R69 Illness, unspecified: Secondary | ICD-10-CM | POA: Diagnosis not present

## 2018-06-15 DIAGNOSIS — H35433 Paving stone degeneration of retina, bilateral: Secondary | ICD-10-CM | POA: Diagnosis not present

## 2018-06-15 DIAGNOSIS — H40013 Open angle with borderline findings, low risk, bilateral: Secondary | ICD-10-CM | POA: Diagnosis not present

## 2018-07-23 DIAGNOSIS — R32 Unspecified urinary incontinence: Secondary | ICD-10-CM | POA: Diagnosis not present

## 2018-07-23 DIAGNOSIS — Z823 Family history of stroke: Secondary | ICD-10-CM | POA: Diagnosis not present

## 2018-07-23 DIAGNOSIS — I10 Essential (primary) hypertension: Secondary | ICD-10-CM | POA: Diagnosis not present

## 2018-07-23 DIAGNOSIS — Z8546 Personal history of malignant neoplasm of prostate: Secondary | ICD-10-CM | POA: Diagnosis not present

## 2018-07-23 DIAGNOSIS — Z8249 Family history of ischemic heart disease and other diseases of the circulatory system: Secondary | ICD-10-CM | POA: Diagnosis not present

## 2018-08-05 DIAGNOSIS — M8588 Other specified disorders of bone density and structure, other site: Secondary | ICD-10-CM | POA: Diagnosis not present

## 2018-10-23 DIAGNOSIS — Z8546 Personal history of malignant neoplasm of prostate: Secondary | ICD-10-CM | POA: Diagnosis not present

## 2018-10-29 DIAGNOSIS — R69 Illness, unspecified: Secondary | ICD-10-CM | POA: Diagnosis not present

## 2018-10-30 DIAGNOSIS — N5231 Erectile dysfunction following radical prostatectomy: Secondary | ICD-10-CM | POA: Diagnosis not present

## 2018-10-30 DIAGNOSIS — N200 Calculus of kidney: Secondary | ICD-10-CM | POA: Diagnosis not present

## 2018-10-30 DIAGNOSIS — Z8546 Personal history of malignant neoplasm of prostate: Secondary | ICD-10-CM | POA: Diagnosis not present

## 2018-11-20 DIAGNOSIS — M25511 Pain in right shoulder: Secondary | ICD-10-CM | POA: Diagnosis not present

## 2018-11-20 DIAGNOSIS — R7309 Other abnormal glucose: Secondary | ICD-10-CM | POA: Diagnosis not present

## 2018-11-20 DIAGNOSIS — I10 Essential (primary) hypertension: Secondary | ICD-10-CM | POA: Diagnosis not present

## 2018-11-20 DIAGNOSIS — E559 Vitamin D deficiency, unspecified: Secondary | ICD-10-CM | POA: Diagnosis not present

## 2018-11-20 DIAGNOSIS — M8580 Other specified disorders of bone density and structure, unspecified site: Secondary | ICD-10-CM | POA: Diagnosis not present

## 2018-11-20 DIAGNOSIS — E7800 Pure hypercholesterolemia, unspecified: Secondary | ICD-10-CM | POA: Diagnosis not present

## 2018-11-20 DIAGNOSIS — Z23 Encounter for immunization: Secondary | ICD-10-CM | POA: Diagnosis not present

## 2018-11-20 DIAGNOSIS — Z0000 Encounter for general adult medical examination without abnormal findings: Secondary | ICD-10-CM | POA: Diagnosis not present

## 2018-11-20 DIAGNOSIS — Z1389 Encounter for screening for other disorder: Secondary | ICD-10-CM | POA: Diagnosis not present

## 2019-05-05 DIAGNOSIS — R69 Illness, unspecified: Secondary | ICD-10-CM | POA: Diagnosis not present

## 2019-05-06 ENCOUNTER — Emergency Department (HOSPITAL_COMMUNITY)
Admission: EM | Admit: 2019-05-06 | Discharge: 2019-05-06 | Disposition: A | Discharge status: Home / Self Care | Payer: Medicare HMO | Attending: Emergency Medicine | Admitting: Emergency Medicine

## 2019-05-06 ENCOUNTER — Emergency Department (HOSPITAL_COMMUNITY): Payer: Medicare HMO

## 2019-05-06 ENCOUNTER — Encounter (HOSPITAL_COMMUNITY): Payer: Self-pay | Admitting: *Deleted

## 2019-05-06 ENCOUNTER — Other Ambulatory Visit: Payer: Self-pay

## 2019-05-06 DIAGNOSIS — Y9289 Other specified places as the place of occurrence of the external cause: Secondary | ICD-10-CM | POA: Insufficient documentation

## 2019-05-06 DIAGNOSIS — W312XXA Contact with powered woodworking and forming machines, initial encounter: Secondary | ICD-10-CM | POA: Diagnosis not present

## 2019-05-06 DIAGNOSIS — S61012A Laceration without foreign body of left thumb without damage to nail, initial encounter: Secondary | ICD-10-CM | POA: Diagnosis not present

## 2019-05-06 DIAGNOSIS — Z23 Encounter for immunization: Secondary | ICD-10-CM | POA: Insufficient documentation

## 2019-05-06 DIAGNOSIS — Y999 Unspecified external cause status: Secondary | ICD-10-CM | POA: Diagnosis not present

## 2019-05-06 DIAGNOSIS — Y9389 Activity, other specified: Secondary | ICD-10-CM | POA: Insufficient documentation

## 2019-05-06 DIAGNOSIS — Y998 Other external cause status: Secondary | ICD-10-CM | POA: Insufficient documentation

## 2019-05-06 DIAGNOSIS — I10 Essential (primary) hypertension: Secondary | ICD-10-CM | POA: Insufficient documentation

## 2019-05-06 DIAGNOSIS — Z79899 Other long term (current) drug therapy: Secondary | ICD-10-CM | POA: Insufficient documentation

## 2019-05-06 MED ORDER — TETANUS-DIPHTH-ACELL PERTUSSIS 5-2.5-18.5 LF-MCG/0.5 IM SUSP
0.5000 mL | Freq: Once | INTRAMUSCULAR | Status: AC
  Administered 2019-05-06: 0.5 mL via INTRAMUSCULAR
  Filled 2019-05-06: qty 0.5

## 2019-05-06 MED ORDER — CEPHALEXIN 500 MG PO CAPS: 500.0000 mg | ORAL_CAPSULE | Freq: Four times a day (QID) | ORAL | 0 refills | Status: DC

## 2019-05-06 MED ORDER — BUPIVACAINE HCL (PF) 0.5 % IJ SOLN
10.0000 mL | Freq: Once | INTRAMUSCULAR | Status: AC
  Administered 2019-05-06: 10 mL
  Filled 2019-05-06: qty 30

## 2019-05-06 NOTE — ED Provider Notes (Signed)
[facility] DEPT Provider Note   CSN: [id number] Arrival date & time: 05/06/19  1152    History   Chief Complaint Chief Complaint  Patient presents with  . Laceration    HPI Lawrence Finley is a 68 y.o. male.     HPI   Lawrence Finley is a 68 y.o. male, patient with no pertinent past medical history, presenting to the ED with left thumb laceration that occurred around 10:30 AM this morning.  States he was using a table saw when his hand slipped.  Bleeding controlled prior to arrival.  Pain is minor, throbbing, nonradiating from the thumb.  Denies any anticoagulation.  Is unsure of his last tetanus update.  Denies numbness, weakness, other injuries.   Past Medical History:  Diagnosis Date  . At risk for sleep apnea    STOP-BANG= 6   SENT TO PCP 12-21-2013  . History of kidney stones 10/2014  . History of melanoma excision   . History of prostate cancer    T1c , Gleason 6 --  s/p  radical prostatectomy  . Hypertension   . Right ureteral stone   . Vitamin D deficiency   . Wears glasses     Patient Active Problem List   Diagnosis Date Noted  . Ureteral calculus 10/17/2014    Past Surgical History:  Procedure Laterality Date  . CYSTOSCOPY WITH RETROGRADE PYELOGRAM, URETEROSCOPY AND STENT PLACEMENT Right 12/26/2014   Procedure: CYSTOSCOPY WITH RETROGRADE PYELOGRAM, URETEROSCOPY, STONE EXTRACTION,  AND STENT PLACEMENT;  Surgeon: Bernestine Amass, MD;  Location: [facility];  Service: Urology;  Laterality: Right;  . EXTRACORPOREAL SHOCK WAVE LITHOTRIPSY Right 10-17-2014  . HOLMIUM LASER APPLICATION Right 3/97/6734   Procedure: HOLMIUM LASER APPLICATION;  Surgeon: Bernestine Amass, MD;  Location: [facility];  Service: Urology;  Laterality: Right;  . ROBOT ASSISTED LAPAROSCOPIC RADICAL PROSTATECTOMY  08/ 2010  . WRIST GANGLION EXCISION  1980's        Home Medications    Prior to Admission medications    Medication Sig Start Date End Date Taking? Authorizing Provider  amLODipine (NORVASC) 10 MG tablet Take 10 mg by mouth at bedtime.    [provider]  cephALEXin (KEFLEX) 500 MG capsule Take 1 capsule (500 mg total) by mouth 4 (four) times daily. 05/06/19   Nitasha Jewel C, PA-C  HYDROcodone-acetaminophen (NORCO/VICODIN) 5-325 MG per tablet Take 1-2 tablets by mouth every 6 (six) hours as needed for moderate pain.    [provider]  Meth-Hyo-M Bl-Na Phos-Ph Sal (URIBEL) 118 MG CAPS Take 1 capsule (118 mg total) by mouth 3 (three) times daily as needed. 12/26/14   Rana Snare, MD  Vitamin D, Ergocalciferol, (DRISDOL) 50000 UNITS CAPS capsule Take 50,000 Units by mouth every 7 (seven) days.    [provider]    Family History No family history on file.  Social History Social History   Tobacco Use  . Smoking status: Never Smoker  . Smokeless tobacco: Never Used  Substance Use Topics  . Alcohol use: Yes    Comment: one drink per night  . Drug use: No     Allergies   Patient has no known allergies.   Review of Systems Review of Systems  Skin: Positive for wound.  Neurological: Negative for weakness and numbness.     Physical Exam Updated Vital Signs BP (!) 137/93 (BP Location: Right Arm)   Pulse 75   Temp 98.6 F (37 C) (  Oral)   Resp 20   SpO2 97%   Physical Exam Vitals signs and nursing note reviewed.  Constitutional:      General: He is not in acute distress.    Appearance: He is well-developed. He is not diaphoretic.  HENT:     Head: Normocephalic and atraumatic.  Eyes:     Conjunctiva/sclera: Conjunctivae normal.  Neck:     Musculoskeletal: Neck supple.  Cardiovascular:     Rate and Rhythm: Normal rate and regular rhythm.     Pulses:          Radial pulses are 2+ on the right side and 2+ on the left side.  Pulmonary:     Effort: Pulmonary effort is normal.  Musculoskeletal:     Comments: 4 cm Laceration to the palmar radial side  of the left thumb.  Full range of motion in each of the joints of the left thumb are intact.  No other injuries noted.  Skin:    General: Skin is warm and dry.     Capillary Refill: Capillary refill takes less than 2 seconds.     Coloration: Skin is not pale.  Neurological:     Mental Status: He is alert.     Comments: Sensation to light touch grossly intact throughout the thumb. Motor function intact in the IP and MCP joints of the thumb. Patient can adduct and abduct the thumb.  Flexion and extension are also intact.  Psychiatric:        Behavior: Behavior normal.                  ED Treatments / Results  Labs (all labs ordered are listed, but only abnormal results are displayed) Labs Reviewed - No data to display  EKG None  Radiology Dg Finger Thumb Left  Result Date: 05/06/2019 CLINICAL DATA:  Table saw laceration this morning. EXAM: LEFT THUMB 2+V COMPARISON:  None. FINDINGS: There is a soft tissue defect/wound involving the palmar aspect of the thumb near the interphalangeal joint. No definite fracture or radiopaque foreign body is identified. IMPRESSION: No acute bony findings or radiopaque foreign body. Electronically Signed   By: Marijo Sanes M.D.   On: 05/06/2019 13:19    Procedures .Marland KitchenLaceration Repair Date/Time: 05/06/2019 3:09 PM Performed by: Lorayne Bender, PA-C Authorized by: Lorayne Bender, PA-C   Consent:    Consent obtained:  Verbal   Consent given by:  Patient   Risks discussed:  Infection, pain, poor cosmetic result, poor wound healing, retained foreign body and need for additional repair Anesthesia (see MAR for exact dosages):    Anesthesia method:  Nerve block   Block location:  Digital   Block needle gauge:  25 G   Block anesthetic:  Bupivacaine 0.5% w/o epi Laceration details:    Location:  Finger   Finger location:  L thumb   Length (cm):  4 Repair type:    Repair type:  Simple Pre-procedure details:    Preparation:  Patient was  prepped and draped in usual sterile fashion and imaging obtained to evaluate for foreign bodies Exploration:    Wound exploration: wound explored through full range of motion and entire depth of wound probed and visualized   Treatment:    Area cleansed with:  Betadine and saline   Amount of cleaning:  Extensive   Irrigation solution:  Sterile saline   Irrigation method:  Syringe Skin repair:    Repair method:  Sutures   Suture size:  4-0   Wound skin closure material used: Vicryl.   Suture technique:  Simple interrupted (Combination of simple interrupted and horizontal mattress)   Number of sutures:  9 Approximation:    Approximation:  Close Post-procedure details:    Dressing:  Sterile dressing   Patient tolerance of procedure:  Tolerated well, no immediate complications .Nerve Block Date/Time: 05/06/2019 2:20 PM Performed by: Lorayne Bender, PA-C Authorized by: Lorayne Bender, PA-C   Consent:    Consent obtained:  Verbal   Consent given by:  Patient Indications:    Indications:  Procedural anesthesia Location:    Body area:  Upper extremity   Upper extremity nerve blocked: Digital, thumb.   Laterality:  Left Procedure details (see MAR for exact dosages):    Block needle gauge:  25 G   Anesthetic injected:  Bupivacaine 0.5% w/o epi   Injection procedure:  Anatomic landmarks identified, anatomic landmarks palpated, incremental injection, introduced needle and negative aspiration for blood Post-procedure details:    Outcome:  Anesthesia achieved   Patient tolerance of procedure:  Tolerated well, no immediate complications   (including critical care time)  Medications Ordered in ED Medications  Tdap (BOOSTRIX) injection 0.5 mL (0.5 mLs Intramuscular Given 05/06/19 1323)  bupivacaine (MARCAINE) 0.5 % injection 10 mL (10 mLs Infiltration Given by Other 05/06/19 1323)     Initial Impression / Assessment and Plan / ED Course  I have reviewed the triage vital signs and the  nursing notes.  Pertinent labs & imaging results that were available during my care of the patient were reviewed by me and considered in my medical decision making (see chart for details).        Patient presents with laceration to left thumb.  No evidence of neurovascular compromise. No osseous abnormality on x-ray.  Range of motion in the thumb is intact.  Laceration repaired without immediate complication noted.  Laceration was repaired with dissolvable sutures on the outside in order to save patient some discomfort during the healing process.  He will follow-up with his PCP for wound check.  The patient was given instructions for home care as well as return precautions. Patient voices understanding of these instructions, accepts the plan, and is comfortable with discharge.   Findings and plan of care discussed with Varney Biles, MD. Dr. Kathrynn Humble personally evaluated and examined this patient.  Final Clinical Impressions(s) / ED Diagnoses   Final diagnoses:  Laceration of left thumb without foreign body without damage to nail, initial encounter    ED Discharge Orders         Ordered    cephALEXin (KEFLEX) 500 MG capsule  4 times daily     05/06/19 Erath, Giovoni Bunch C, PA-C 05/07/19 1637    Varney Biles, MD 05/08/19 0730

## 2019-05-06 NOTE — Discharge Instructions (Signed)
°  Wound Care - Laceration You may remove the bandage after 24 hours. Clean the wound and surrounding area gently with tap water and mild soap. Rinse well and blot dry. Do not scrub the wound, as this may cause the wound edges to come apart. You may shower, but avoid submerging the wound, such as with a bath or swimming. Clean the wound daily to prevent infection. Do not use cleaners such as hydrogen peroxide or alcohol.   Please take all of your antibiotics until finished!   You may develop abdominal discomfort or diarrhea from the antibiotic.  You may help offset this with probiotics which you can buy or get in yogurt. Do not eat or take the probiotics until 2 hours after your antibiotic.   Scar reduction: Application of a topical antibiotic ointment, such as Neosporin, after the wound has begun to close and heal well can decrease scab formation and reduce scarring. After the wound has healed and wound closures have been removed, application of ointments such as Aquaphor can also reduce scar formation.  The key to scar reduction is keeping the skin well hydrated and supple. Drinking plenty of water throughout the day (At least eight 8oz glasses of water a day) is essential to staying well hydrated.  Sun exposure: Keep the wound out of the sun. After the wound has healed, continue to protect it from the sun by wearing protective clothing or applying sunscreen.  Pain: You may use Tylenol, naproxen, or ibuprofen for pain.  Suture/staple removal: This type of suture typically will dissolve and fall out on its own.  If they do not follow-up by 10 days, may return for suture removal.  Return to the ED sooner should the wound edges come apart or signs of infection arise, such as spreading redness, puffiness/swelling, pus draining from the wound, severe increase in pain, fever over 100.87F, or any other major issues.  For prescription assistance, may try using prescription discount sites or apps, such as  goodrx.com

## 2019-05-06 NOTE — ED Triage Notes (Signed)
Pt presents to ED with a c/o left thumb laceration, happened around 1030 today, pt was using a table saw. Bleeding is controlled at this time, pulses palpable, pt denies pain. Per pt last tetanus shot is within 5 years.

## 2019-05-06 NOTE — ED Notes (Signed)
Bed: WTR8 Expected date:  Expected time:  Means of arrival:  Comments: 

## 2019-05-12 DIAGNOSIS — R69 Illness, unspecified: Secondary | ICD-10-CM | POA: Diagnosis not present

## 2019-05-12 DIAGNOSIS — S61012D Laceration without foreign body of left thumb without damage to nail, subsequent encounter: Secondary | ICD-10-CM | POA: Diagnosis not present

## 2019-05-17 DIAGNOSIS — S61012D Laceration without foreign body of left thumb without damage to nail, subsequent encounter: Secondary | ICD-10-CM | POA: Diagnosis not present

## 2019-05-19 ENCOUNTER — Other Ambulatory Visit: Payer: Self-pay | Admitting: Orthopedic Surgery

## 2019-05-19 ENCOUNTER — Other Ambulatory Visit: Payer: Self-pay

## 2019-05-19 ENCOUNTER — Encounter (HOSPITAL_BASED_OUTPATIENT_CLINIC_OR_DEPARTMENT_OTHER): Payer: Self-pay | Admitting: *Deleted

## 2019-05-19 DIAGNOSIS — S66022A Laceration of long flexor muscle, fascia and tendon of left thumb at wrist and hand level, initial encounter: Secondary | ICD-10-CM | POA: Diagnosis not present

## 2019-05-21 ENCOUNTER — Encounter (HOSPITAL_BASED_OUTPATIENT_CLINIC_OR_DEPARTMENT_OTHER)
Admission: RE | Admit: 2019-05-21 | Discharge: 2019-05-21 | Disposition: A | Discharge status: Home / Self Care | Payer: Medicare HMO | Source: Ambulatory Visit | Attending: Orthopedic Surgery | Admitting: Orthopedic Surgery

## 2019-05-21 ENCOUNTER — Other Ambulatory Visit: Payer: Self-pay

## 2019-05-21 ENCOUNTER — Other Ambulatory Visit (HOSPITAL_COMMUNITY)
Admission: RE | Admit: 2019-05-21 | Discharge: 2019-05-21 | Disposition: A | Discharge status: Home / Self Care | Payer: Medicare HMO | Source: Ambulatory Visit | Attending: Orthopedic Surgery | Admitting: Orthopedic Surgery

## 2019-05-21 DIAGNOSIS — R001 Bradycardia, unspecified: Secondary | ICD-10-CM | POA: Diagnosis not present

## 2019-05-21 DIAGNOSIS — Z1159 Encounter for screening for other viral diseases: Secondary | ICD-10-CM | POA: Insufficient documentation

## 2019-05-21 DIAGNOSIS — Z01818 Encounter for other preprocedural examination: Secondary | ICD-10-CM | POA: Diagnosis not present

## 2019-05-22 LAB — NOVEL CORONAVIRUS, NAA (HOSP ORDER, SEND-OUT TO REF LAB; TAT 18-24 HRS): SARS-CoV-2, NAA: NOT DETECTED

## 2019-05-24 DIAGNOSIS — R7309 Other abnormal glucose: Secondary | ICD-10-CM | POA: Diagnosis not present

## 2019-05-24 DIAGNOSIS — I10 Essential (primary) hypertension: Secondary | ICD-10-CM | POA: Diagnosis not present

## 2019-05-24 DIAGNOSIS — M8580 Other specified disorders of bone density and structure, unspecified site: Secondary | ICD-10-CM | POA: Diagnosis not present

## 2019-05-24 DIAGNOSIS — E7800 Pure hypercholesterolemia, unspecified: Secondary | ICD-10-CM | POA: Diagnosis not present

## 2019-05-24 DIAGNOSIS — E559 Vitamin D deficiency, unspecified: Secondary | ICD-10-CM | POA: Diagnosis not present

## 2019-05-24 NOTE — Anesthesia Preprocedure Evaluation (Addendum)
Anesthesia Evaluation  Patient identified by MRN, date of birth, ID band Patient awake    Reviewed: Allergy & Precautions, NPO status , Patient's Chart, lab work & pertinent test results  History of Anesthesia Complications Negative for: history of anesthetic complications  Airway Mallampati: II  TM Distance: >3 FB Neck ROM: Full    Dental no notable dental hx.    Pulmonary neg pulmonary ROS,    Pulmonary exam normal        Cardiovascular hypertension, Pt. on medications Normal cardiovascular exam     Neuro/Psych negative neurological ROS     GI/Hepatic negative GI ROS, Neg liver ROS,   Endo/Other  negative endocrine ROS  Renal/GU negative Renal ROS     Musculoskeletal negative musculoskeletal ROS (+)   Abdominal   Peds  Hematology negative hematology ROS (+)   Anesthesia Other Findings Day of surgery medications reviewed with the patient.  Reproductive/Obstetrics                            Anesthesia Physical Anesthesia Plan  ASA: II  Anesthesia Plan: General   Post-op Pain Management:    Induction: Intravenous  PONV Risk Score and Plan: 2 and Treatment may vary due to age or medical condition, Ondansetron and Dexamethasone  Airway Management Planned: LMA  Additional Equipment: None  Intra-op Plan:   Post-operative Plan: Extubation in OR  Informed Consent: I have reviewed the patients History and Physical, chart, labs and discussed the procedure including the risks, benefits and alternatives for the proposed anesthesia with the patient or authorized representative who has indicated his/her understanding and acceptance.     Dental advisory given  Plan Discussed with: CRNA  Anesthesia Plan Comments:        Anesthesia Quick Evaluation

## 2019-05-25 ENCOUNTER — Ambulatory Visit (HOSPITAL_BASED_OUTPATIENT_CLINIC_OR_DEPARTMENT_OTHER): Payer: Medicare HMO | Admitting: Anesthesiology

## 2019-05-25 ENCOUNTER — Encounter (HOSPITAL_BASED_OUTPATIENT_CLINIC_OR_DEPARTMENT_OTHER)
Admission: RE | Disposition: A | Discharge status: Home / Self Care | Payer: Self-pay | Source: Home / Self Care | Attending: Orthopedic Surgery

## 2019-05-25 ENCOUNTER — Ambulatory Visit (HOSPITAL_BASED_OUTPATIENT_CLINIC_OR_DEPARTMENT_OTHER)
Admission: RE | Admit: 2019-05-25 | Discharge: 2019-05-25 | Disposition: A | Discharge status: Home / Self Care | Payer: Medicare HMO | Attending: Orthopedic Surgery | Admitting: Orthopedic Surgery

## 2019-05-25 ENCOUNTER — Other Ambulatory Visit: Payer: Self-pay

## 2019-05-25 ENCOUNTER — Encounter (HOSPITAL_BASED_OUTPATIENT_CLINIC_OR_DEPARTMENT_OTHER): Payer: Self-pay | Admitting: *Deleted

## 2019-05-25 DIAGNOSIS — X58XXXA Exposure to other specified factors, initial encounter: Secondary | ICD-10-CM | POA: Diagnosis not present

## 2019-05-25 DIAGNOSIS — S65412A Laceration of blood vessel of left thumb, initial encounter: Secondary | ICD-10-CM | POA: Diagnosis not present

## 2019-05-25 DIAGNOSIS — Z8546 Personal history of malignant neoplasm of prostate: Secondary | ICD-10-CM | POA: Insufficient documentation

## 2019-05-25 DIAGNOSIS — Z85820 Personal history of malignant melanoma of skin: Secondary | ICD-10-CM | POA: Diagnosis not present

## 2019-05-25 DIAGNOSIS — S62512B Displaced fracture of proximal phalanx of left thumb, initial encounter for open fracture: Secondary | ICD-10-CM | POA: Diagnosis not present

## 2019-05-25 DIAGNOSIS — S66022A Laceration of long flexor muscle, fascia and tendon of left thumb at wrist and hand level, initial encounter: Secondary | ICD-10-CM | POA: Insufficient documentation

## 2019-05-25 DIAGNOSIS — S61012A Laceration without foreign body of left thumb without damage to nail, initial encounter: Secondary | ICD-10-CM | POA: Diagnosis present

## 2019-05-25 DIAGNOSIS — Z79899 Other long term (current) drug therapy: Secondary | ICD-10-CM | POA: Diagnosis not present

## 2019-05-25 DIAGNOSIS — E559 Vitamin D deficiency, unspecified: Secondary | ICD-10-CM | POA: Diagnosis not present

## 2019-05-25 DIAGNOSIS — S6432XA Injury of digital nerve of left thumb, initial encounter: Secondary | ICD-10-CM | POA: Diagnosis not present

## 2019-05-25 DIAGNOSIS — S5422XA Injury of radial nerve at forearm level, left arm, initial encounter: Secondary | ICD-10-CM | POA: Diagnosis not present

## 2019-05-25 DIAGNOSIS — S62522B Displaced fracture of distal phalanx of left thumb, initial encounter for open fracture: Secondary | ICD-10-CM | POA: Diagnosis not present

## 2019-05-25 DIAGNOSIS — Z7982 Long term (current) use of aspirin: Secondary | ICD-10-CM | POA: Insufficient documentation

## 2019-05-25 DIAGNOSIS — S56022A Laceration of flexor muscle, fascia and tendon of left thumb at forearm level, initial encounter: Secondary | ICD-10-CM | POA: Diagnosis not present

## 2019-05-25 DIAGNOSIS — I10 Essential (primary) hypertension: Secondary | ICD-10-CM | POA: Insufficient documentation

## 2019-05-25 HISTORY — DX: Malignant (primary) neoplasm, unspecified: C80.1

## 2019-05-25 HISTORY — PX: NERVE AND TENDON REPAIR: SHX5693

## 2019-05-25 HISTORY — DX: Malignant melanoma of skin, unspecified: C43.9

## 2019-05-25 SURGERY — NERVE AND TENDON REPAIR
Anesthesia: General | Site: Thumb | Laterality: Left

## 2019-05-25 MED ORDER — EPHEDRINE SULFATE 50 MG/ML IJ SOLN
INTRAMUSCULAR | Status: DC | PRN
  Administered 2019-05-25: 10 mg via INTRAVENOUS

## 2019-05-25 MED ORDER — FENTANYL CITRATE (PF) 100 MCG/2ML IJ SOLN
50.0000 ug | INTRAMUSCULAR | Status: DC | PRN
  Administered 2019-05-25: 100 ug via INTRAVENOUS
  Administered 2019-05-25: 50 ug via INTRAVENOUS

## 2019-05-25 MED ORDER — SCOPOLAMINE 1 MG/3DAYS TD PT72: 1.0000 | MEDICATED_PATCH | Freq: Once | TRANSDERMAL | Status: DC | PRN

## 2019-05-25 MED ORDER — OXYCODONE HCL 5 MG PO TABS
ORAL_TABLET | ORAL | Status: AC
  Filled 2019-05-25: qty 1

## 2019-05-25 MED ORDER — CEFAZOLIN SODIUM-DEXTROSE 2-4 GM/100ML-% IV SOLN
2.0000 g | INTRAVENOUS | Status: AC
  Administered 2019-05-25: 2 g via INTRAVENOUS

## 2019-05-25 MED ORDER — SULFAMETHOXAZOLE-TRIMETHOPRIM 800-160 MG PO TABS: 1.0000 | ORAL_TABLET | Freq: Two times a day (BID) | ORAL | 0 refills | Status: AC

## 2019-05-25 MED ORDER — OXYCODONE HCL 5 MG/5ML PO SOLN: 5.0000 mg | Freq: Once | ORAL | Status: AC | PRN

## 2019-05-25 MED ORDER — PROMETHAZINE HCL 25 MG/ML IJ SOLN: 6.2500 mg | INTRAMUSCULAR | Status: DC | PRN

## 2019-05-25 MED ORDER — MIDAZOLAM HCL 2 MG/2ML IJ SOLN
INTRAMUSCULAR | Status: AC
  Filled 2019-05-25: qty 2

## 2019-05-25 MED ORDER — PROPOFOL 10 MG/ML IV BOLUS
INTRAVENOUS | Status: DC | PRN
  Administered 2019-05-25: 200 mg via INTRAVENOUS

## 2019-05-25 MED ORDER — DEXAMETHASONE SODIUM PHOSPHATE 10 MG/ML IJ SOLN
INTRAMUSCULAR | Status: DC | PRN
  Administered 2019-05-25: 10 mg via INTRAVENOUS

## 2019-05-25 MED ORDER — FENTANYL CITRATE (PF) 100 MCG/2ML IJ SOLN
INTRAMUSCULAR | Status: AC
  Filled 2019-05-25: qty 2

## 2019-05-25 MED ORDER — LIDOCAINE HCL (CARDIAC) PF 100 MG/5ML IV SOSY
PREFILLED_SYRINGE | INTRAVENOUS | Status: DC | PRN
  Administered 2019-05-25: 75 mg via INTRAVENOUS

## 2019-05-25 MED ORDER — OXYCODONE HCL 5 MG PO TABS
5.0000 mg | ORAL_TABLET | Freq: Once | ORAL | Status: AC | PRN
  Administered 2019-05-25: 5 mg via ORAL

## 2019-05-25 MED ORDER — HYDROCODONE-ACETAMINOPHEN 5-325 MG PO TABS: ORAL_TABLET | ORAL | 0 refills | Status: AC

## 2019-05-25 MED ORDER — ACETAMINOPHEN 10 MG/ML IV SOLN: 1000.0000 mg | Freq: Once | INTRAVENOUS | Status: DC | PRN

## 2019-05-25 MED ORDER — FENTANYL CITRATE (PF) 100 MCG/2ML IJ SOLN: 25.0000 ug | INTRAMUSCULAR | Status: DC | PRN

## 2019-05-25 MED ORDER — CEFAZOLIN SODIUM-DEXTROSE 2-4 GM/100ML-% IV SOLN
INTRAVENOUS | Status: AC
  Filled 2019-05-25: qty 100

## 2019-05-25 MED ORDER — LACTATED RINGERS IV SOLN
INTRAVENOUS | Status: DC
  Administered 2019-05-25 (×2): via INTRAVENOUS

## 2019-05-25 MED ORDER — BUPIVACAINE HCL (PF) 0.25 % IJ SOLN
INTRAMUSCULAR | Status: DC | PRN
  Administered 2019-05-25: 10 mL

## 2019-05-25 MED ORDER — CHLORHEXIDINE GLUCONATE 4 % EX LIQD: 60.0000 mL | Freq: Once | CUTANEOUS | Status: DC

## 2019-05-25 MED ORDER — MIDAZOLAM HCL 2 MG/2ML IJ SOLN
1.0000 mg | INTRAMUSCULAR | Status: DC | PRN
  Administered 2019-05-25: 2 mg via INTRAVENOUS

## 2019-05-25 SURGICAL SUPPLY — 67 items
BAG DECANTER FOR FLEXI CONT (MISCELLANEOUS) IMPLANT
BANDAGE ACE 3X5.8 VEL STRL LF (GAUZE/BANDAGES/DRESSINGS) ×3 IMPLANT
BLADE MINI RND TIP GREEN BEAV (BLADE) IMPLANT
BLADE SURG 15 STRL LF DISP TIS (BLADE) ×4 IMPLANT
BLADE SURG 15 STRL SS (BLADE) ×2
BNDG ESMARK 4X9 LF (GAUZE/BANDAGES/DRESSINGS) ×1 IMPLANT
BNDG GAUZE ELAST 4 BULKY (GAUZE/BANDAGES/DRESSINGS) ×3 IMPLANT
BRUSH SCRUB EZ PLAIN DRY (MISCELLANEOUS) ×2 IMPLANT
CATH ROBINSON RED A/P 10FR (CATHETERS) IMPLANT
CHLORAPREP W/TINT 26 (MISCELLANEOUS) ×3 IMPLANT
CORD BIPOLAR FORCEPS 12FT (ELECTRODE) ×3 IMPLANT
COVER BACK TABLE REUSABLE LG (DRAPES) ×3 IMPLANT
COVER MAYO STAND REUSABLE (DRAPES) ×3 IMPLANT
COVER WAND RF STERILE (DRAPES) IMPLANT
CUFF TOURN SGL QUICK 18X4 (TOURNIQUET CUFF) ×1 IMPLANT
DECANTER SPIKE VIAL GLASS SM (MISCELLANEOUS) ×2 IMPLANT
DRAPE EXTREMITY T 121X128X90 (DISPOSABLE) ×3 IMPLANT
DRAPE SURG 17X23 STRL (DRAPES) ×3 IMPLANT
GAUZE SPONGE 4X4 12PLY STRL (GAUZE/BANDAGES/DRESSINGS) ×3 IMPLANT
GAUZE XEROFORM 1X8 LF (GAUZE/BANDAGES/DRESSINGS) ×3 IMPLANT
GLOVE BIO SURGEON STRL SZ7.5 (GLOVE) ×3 IMPLANT
GLOVE BIOGEL PI IND STRL 7.0 (GLOVE) IMPLANT
GLOVE BIOGEL PI IND STRL 8 (GLOVE) ×2 IMPLANT
GLOVE BIOGEL PI IND STRL 8.5 (GLOVE) IMPLANT
GLOVE BIOGEL PI IND STRL 9 (GLOVE) IMPLANT
GLOVE BIOGEL PI INDICATOR 7.0 (GLOVE)
GLOVE BIOGEL PI INDICATOR 8 (GLOVE) ×1
GLOVE BIOGEL PI INDICATOR 8.5 (GLOVE) ×1
GLOVE BIOGEL PI INDICATOR 9 (GLOVE) ×2
GLOVE ECLIPSE 6.5 STRL STRAW (GLOVE) ×1 IMPLANT
GLOVE SURG ORTHO 8.0 STRL STRW (GLOVE) ×1 IMPLANT
GOWN STRL REUS W/ TWL LRG LVL3 (GOWN DISPOSABLE) ×2 IMPLANT
GOWN STRL REUS W/TWL LRG LVL3 (GOWN DISPOSABLE) ×1
GOWN STRL REUS W/TWL XL LVL3 (GOWN DISPOSABLE) ×4 IMPLANT
LOOP VESSEL MAXI BLUE (MISCELLANEOUS) IMPLANT
NDL HYPO 25X1 1.5 SAFETY (NEEDLE) IMPLANT
NDL SAFETY ECLIPSE 18X1.5 (NEEDLE) IMPLANT
NEEDLE HYPO 18GX1.5 SHARP (NEEDLE)
NEEDLE HYPO 25X1 1.5 SAFETY (NEEDLE) ×3 IMPLANT
NS IRRIG 1000ML POUR BTL (IV SOLUTION) ×3 IMPLANT
PACK BASIN DAY SURGERY FS (CUSTOM PROCEDURE TRAY) ×3 IMPLANT
PAD CAST 3X4 CTTN HI CHSV (CAST SUPPLIES) ×2 IMPLANT
PAD CAST 4YDX4 CTTN HI CHSV (CAST SUPPLIES) IMPLANT
PADDING CAST ABS 4INX4YD NS (CAST SUPPLIES) ×1
PADDING CAST ABS COTTON 4X4 ST (CAST SUPPLIES) ×2 IMPLANT
PADDING CAST COTTON 3X4 STRL (CAST SUPPLIES) ×1
PADDING CAST COTTON 4X4 STRL (CAST SUPPLIES)
SLEEVE SCD COMPRESS KNEE MED (MISCELLANEOUS) ×1 IMPLANT
SPEAR EYE SURG WECK-CEL (MISCELLANEOUS) ×3 IMPLANT
SPLINT PLASTER CAST XFAST 3X15 (CAST SUPPLIES) IMPLANT
SPLINT PLASTER XTRA FASTSET 3X (CAST SUPPLIES) ×30
STOCKINETTE 4X48 STRL (DRAPES) ×3 IMPLANT
SUT CHROMIC 4 0 P 3 18 (SUTURE) ×1 IMPLANT
SUT ETHIBOND 3-0 V-5 (SUTURE) IMPLANT
SUT ETHILON 4 0 PS 2 18 (SUTURE) ×3 IMPLANT
SUT FIBERWIRE 4-0 18 TAPR NDL (SUTURE)
SUT NYLON 9 0 VRM6 (SUTURE) ×1 IMPLANT
SUT PROLENE 6 0 P 1 18 (SUTURE) IMPLANT
SUT SILK 4 0 PS 2 (SUTURE) ×1 IMPLANT
SUT SUPRAMID 4-0 (SUTURE) ×1 IMPLANT
SUT VICRYL 4-0 PS2 18IN ABS (SUTURE) IMPLANT
SUTURE FIBERWR 4-0 18 TAPR NDL (SUTURE) IMPLANT
SYR BULB 3OZ (MISCELLANEOUS) ×3 IMPLANT
SYR CONTROL 10ML LL (SYRINGE) IMPLANT
TOWEL GREEN STERILE FF (TOWEL DISPOSABLE) ×6 IMPLANT
TRAY DSU PREP LF (CUSTOM PROCEDURE TRAY) IMPLANT
UNDERPAD 30X30 (UNDERPADS AND DIAPERS) ×2 IMPLANT

## 2019-05-25 NOTE — Op Note (Signed)
I assisted Surgeon(s) and Role:    * Leanora Cover, MD - Primary    * Daryll Brod, MD - Assisting on the Procedure(s): LEFT THUMB Beatty, Aquilla on 05/25/2019.  I provided assistance on this case as follows:setup, debridement, debridement of the fracture, repair of the flexor tendon, identification and repair of the nerve using the operative microscope, closure of the wound and application of the dressingand splint.  Electronically signed by: Daryll Brod, MD Date: 05/25/2019 Time: 2:33 PM

## 2019-05-25 NOTE — Op Note (Signed)
NAME: Lawrence Finley MEDICAL RECORD NO: [id number] DATE OF BIRTH: [birth year] FACILITY: [facility] LOCATION: [facility] PHYSICIAN: Tennis Must, MD   OPERATIVE REPORT   DATE OF PROCEDURE: 05/25/19    PREOPERATIVE DIAGNOSIS:   Left thumb laceration with possible tendon artery nerve laceration   POSTOPERATIVE DIAGNOSIS:   1.  Left thumb FPL laceration 2.  Left thumb radial digital nerve and artery lacerations 3.  Left thumb open proximal and distal phalangeal fractures   PROCEDURE:   1.  Left thumb repair FPL laceration 2.  Left thumb repair radial digital nerve laceration under microscope 3.  Left thumb irrigation and debridement of open proximal and distal phalangeal fractures including removal of bone fragments   SURGEON:  Leanora Cover, M.D.   ASSISTANT: Daryll Brod, MD   ANESTHESIA:  General   INTRAVENOUS FLUIDS:  Per anesthesia flow sheet.   ESTIMATED BLOOD LOSS:  Minimal.   COMPLICATIONS:  None.   SPECIMENS:  none   TOURNIQUET TIME:    Total Tourniquet Time Documented: Upper Arm (Left) - 74 minutes Total: Upper Arm (Left) - 74 minutes    DISPOSITION:  Stable to PACU.   INDICATIONS: 68 year old male sustained laceration to the left thumb proximally 2 weeks ago .  He was seen in the emergency department where the wound was sutured in he followed up with his primary care physician who referred him to our office for further care.  I recommended operative exploration with repair of tendon artery nerve is necessary.  Risks, benefits and alternatives of surgery were discussed including the risks of blood loss, infection, damage to nerves, vessels, tendons, ligaments, bone for surgery, need for additional surgery, complications with wound healing, continued pain, nonunion, malunion, stiffness.  He voiced understanding of these risks and elected to proceed.  OPERATIVE COURSE:  After being identified preoperatively by myself,  the patient and I agreed on the  procedure and site of the procedure.  The surgical site was marked.  Surgical consent had been signed. He was given IV antibiotics as preoperative antibiotic prophylaxis. He was transferred to the operating room and placed on the operating table in supine position with the Left upper extremity on an arm board.  General anesthesia was induced by the anesthesiologist.  Left upper extremity was prepped and draped in normal sterile orthopedic fashion.  A surgical pause was performed between the surgeons, anesthesia, and operating room staff and all were in agreement as to the patient, procedure, and site of procedure.  Tourniquet at the proximal aspect of the extremity was inflated to 250 mmHg after exsanguination of the arm with an Esmarch bandage.    The wound was opened.  It was extended proximally along its initial course.  There was laceration of the radial digital nerve and artery.  The artery was diminutive in size.  There was laceration of the FPL tendon with retraction to the level of the A1 pulley.  There was a fracture from the curve of the saw at the base of the distal phalanx and distal aspect of the proximal phalanx between the condyles.  There were 2 bone fragments.  These were sharply removed with a knife.  The fracture lines were debrided with a curette.  Skin edges were sharply debrided with the scissors.  The wound was copiously irrigated with sterile saline.  The remaining volar plate and capsule was repaired back over the joint with a 4-0 chromic suture in a figure-of-eight fashion.  The FPL tendon was  retrieved from proximally after extending the incision proximally.  A 4 oh looped Supramid suture was used.  A modified Kessler technique was used.  The sutures passed the proximal stump of the tendon.  This allowed good reapproximation of the tendon ends.  The distal portion was then thrown again good reapproximation of the tendon ends was achieved.  The microscope was brought in.  This was used for  microdissection of the radial digital nerve and artery.  The artery was diminutive and nonrepairable.  The digital nerve was reapproximated to its distal stump with 9-0 nylon suture in a erupted fashion.  Good reapproximation was obtained of 2 branches.  Laceration was distal to the trifurcation.  The wound was then closed with 4-0 nylon in a horizontal mattress fashion.  Digital block was performed with quarter percent plain Marcaine to aid in postoperative analgesia.  Wound was dressed with sterile Xeroform 4 x 4's and wrapped with a Kerlix bandage.  Dorsal blocking splint including the wrist and thumb was placed.  This was wrapped with Kerlix and Ace bandage.  The tourniquet was deflated at 74 minutes.  Fingertips were pink with brisk capillary refill after deflation of tourniquet.  The operative  drapes were broken down.  The patient was awoken from anesthesia safely.  He was transferred back to the stretcher and taken to PACU in stable condition.  I will see him back in the office in 1 week for postoperative followup.  I will give him a prescription for Norco 5/325 1-2 tabs PO q6 hours prn pain, dispense # 20 and Bactrim DS 1 p.o. twice daily x7 days.   Leanora Cover, MD Electronically signed, 05/25/19

## 2019-05-25 NOTE — Anesthesia Procedure Notes (Signed)
Procedure Name: LMA Insertion Date/Time: 05/25/2019 1:09 PM Performed by: Willa Frater, CRNA Pre-anesthesia Checklist: Patient identified, Emergency Drugs available, Suction available and Patient being monitored Patient Re-evaluated:Patient Re-evaluated prior to induction Oxygen Delivery Method: Circle system utilized Preoxygenation: Pre-oxygenation with 100% oxygen Induction Type: IV induction Ventilation: Mask ventilation without difficulty LMA: LMA inserted LMA Size: 5.0 Number of attempts: 1 Airway Equipment and Method: Bite block Placement Confirmation: positive ETCO2 Tube secured with: Tape Dental Injury: Teeth and Oropharynx as per pre-operative assessment

## 2019-05-25 NOTE — H&P (Signed)
  Lawrence Finley is an 68 y.o. male.   Chief Complaint: left thumb laceration HPI: 68 yo rhd male states he sustained laceration to left thumb 05/06/19.  Seen in ED where wound cleaned and sutured.  Followed up with PCP who referred him for further care.  Decreased sensation distal to wound.  Allergies: No Known Allergies  Past Medical History:  Diagnosis Date  . At risk for sleep apnea    STOP-BANG= 6   SENT TO PCP 12-21-2013  . Cancer [facility])    prostate cancer-surgery  . History of kidney stones 10/2014  . History of melanoma excision   . History of prostate cancer    T1c , Gleason 6 --  s/p  radical prostatectomy  . Hypertension   . Melanoma ([place])    left arm>25 yrs ago  . Right ureteral stone   . Vitamin D deficiency   . Wears glasses     Past Surgical History:  Procedure Laterality Date  . CYSTOSCOPY WITH RETROGRADE PYELOGRAM, URETEROSCOPY AND STENT PLACEMENT Right 12/26/2014   Procedure: CYSTOSCOPY WITH RETROGRADE PYELOGRAM, URETEROSCOPY, STONE EXTRACTION,  AND STENT PLACEMENT;  Surgeon: Bernestine Amass, MD;  Location: [facility];  Service: Urology;  Laterality: Right;  . EXTRACORPOREAL SHOCK WAVE LITHOTRIPSY Right 10-17-2014  . HOLMIUM LASER APPLICATION Right 2/84/1324   Procedure: HOLMIUM LASER APPLICATION;  Surgeon: Bernestine Amass, MD;  Location: [facility];  Service: Urology;  Laterality: Right;  . ROBOT ASSISTED LAPAROSCOPIC RADICAL PROSTATECTOMY  08/ 2010  . WRIST GANGLION EXCISION  1980's    Family History: History reviewed. No pertinent family history.  Social History:   reports that he has never smoked. He has never used smokeless tobacco. He reports current alcohol use. He reports that he does not use drugs.  Medications: Medications Prior to Admission  Medication Sig Dispense Refill  . amLODipine (NORVASC) 10 MG tablet Take 10 mg by mouth at bedtime.    Marland Kitchen aspirin EC 81 MG tablet Take 81 mg by mouth daily.    . cephALEXin  (KEFLEX) 500 MG capsule Take 1 capsule (500 mg total) by mouth 4 (four) times daily. 20 capsule 0  . ibuprofen (ADVIL) 200 MG tablet Take 200 mg by mouth every 6 (six) hours as needed.    . Vitamin D, Ergocalciferol, (DRISDOL) 50000 UNITS CAPS capsule Take 50,000 Units by mouth every 7 (seven) days.      No results found for this or any previous visit (from the past 48 hour(s)).  No results found.   A comprehensive review of systems was negative.  Blood pressure (!) 159/82, pulse 65, temperature (!) 97.4 F (36.3 C), temperature source Oral, resp. rate 16, height 5\' 11"  (1.803 m), weight 94.6 kg, SpO2 100 %.  General appearance: alert, cooperative and appears stated age Head: Normocephalic, without obvious abnormality, atraumatic Neck: supple, symmetrical, trachea midline Cardio: regular rate and rhythm Resp: clear to auscultation bilaterally Extremities: Intact sensation and capillary refill all digits.  +epl/fpl/io.  Laceration at ip flexion crease. Pulses: 2+ and symmetric Skin: Skin color, texture, turgor normal. No rashes or lesions Neurologic: Grossly normal Incision/Wound: as above  Assessment/Plan Left thumb laceration.  Plan exploration with repair tendon/artery/nerve as necessary.  Risks, benefits, and alternatives of surgery have been discussed and the patient agrees with the plan of care.   Leanora Cover 05/25/2019, 12:16 PM

## 2019-05-25 NOTE — Anesthesia Postprocedure Evaluation (Signed)
Anesthesia Post Note  Patient: Lawrence Finley  Procedure(s) Performed: LEFT THUMB EXPLORATIN WITH NERVE AND TENDON  REPAIR, DEBRIDEMENT OF FRACUTRE (Left Thumb)     Patient location during evaluation: PACU Anesthesia Type: General Level of consciousness: awake and alert Pain management: pain level controlled Vital Signs Assessment: post-procedure vital signs reviewed and stable Respiratory status: spontaneous breathing, nonlabored ventilation and respiratory function stable Cardiovascular status: blood pressure returned to baseline and stable Postop Assessment: no apparent nausea or vomiting Anesthetic complications: no    Last Vitals:  Vitals:   05/25/19 1515 05/25/19 1530  BP: 132/87 136/87  Pulse: 61 (!) 59  Resp: 16 18  Temp:    SpO2: 95% 95%    Last Pain:  Vitals:   05/25/19 1515  TempSrc:   PainSc: [name]

## 2019-05-25 NOTE — Discharge Instructions (Addendum)

## 2019-05-25 NOTE — Transfer of Care (Signed)
Immediate Anesthesia Transfer of Care Note  Patient: Lawrence Finley  Procedure(s) Performed: LEFT THUMB EXPLORATIN WITH NERVE AND TENDON  REPAIR, DEBRIDEMENT OF FRACUTRE (Left Thumb)  Patient Location: PACU  Anesthesia Type:General  Level of Consciousness: sedated  Airway & Oxygen Therapy: Patient Spontanous Breathing and Patient connected to face mask oxygen  Post-op Assessment: Report given to RN and Post -op Vital signs reviewed and stable  Post vital signs: Reviewed and stable  Last Vitals:  Vitals Value Taken Time  BP 123/81 05/25/19 1444  Temp    Pulse 63 05/25/19 1445  Resp 0 05/25/19 1445  SpO2 97 % 05/25/19 1445  Vitals shown include unvalidated device data.  Last Pain:  Vitals:   05/25/19 1147  TempSrc: Oral  PainSc: 0-No pain         Complications: No apparent anesthesia complications

## 2019-05-26 ENCOUNTER — Encounter (HOSPITAL_BASED_OUTPATIENT_CLINIC_OR_DEPARTMENT_OTHER): Payer: Self-pay | Admitting: Orthopedic Surgery

## 2019-06-02 DIAGNOSIS — M79645 Pain in left finger(s): Secondary | ICD-10-CM | POA: Diagnosis not present

## 2019-06-02 DIAGNOSIS — M25642 Stiffness of left hand, not elsewhere classified: Secondary | ICD-10-CM | POA: Diagnosis not present

## 2019-06-03 ENCOUNTER — Encounter (HOSPITAL_BASED_OUTPATIENT_CLINIC_OR_DEPARTMENT_OTHER): Payer: Self-pay | Admitting: Orthopedic Surgery

## 2019-06-09 DIAGNOSIS — S66022A Laceration of long flexor muscle, fascia and tendon of left thumb at wrist and hand level, initial encounter: Secondary | ICD-10-CM | POA: Diagnosis not present

## 2019-07-07 DIAGNOSIS — S66022A Laceration of long flexor muscle, fascia and tendon of left thumb at wrist and hand level, initial encounter: Secondary | ICD-10-CM | POA: Diagnosis not present

## 2019-07-07 DIAGNOSIS — M79645 Pain in left finger(s): Secondary | ICD-10-CM | POA: Diagnosis not present

## 2019-07-07 DIAGNOSIS — M25642 Stiffness of left hand, not elsewhere classified: Secondary | ICD-10-CM | POA: Diagnosis not present

## 2019-07-09 DIAGNOSIS — I10 Essential (primary) hypertension: Secondary | ICD-10-CM | POA: Diagnosis not present

## 2019-07-09 DIAGNOSIS — Z8546 Personal history of malignant neoplasm of prostate: Secondary | ICD-10-CM | POA: Diagnosis not present

## 2019-07-09 DIAGNOSIS — Z85828 Personal history of other malignant neoplasm of skin: Secondary | ICD-10-CM | POA: Diagnosis not present

## 2019-07-09 DIAGNOSIS — Z7982 Long term (current) use of aspirin: Secondary | ICD-10-CM | POA: Diagnosis not present

## 2019-07-15 DIAGNOSIS — M25642 Stiffness of left hand, not elsewhere classified: Secondary | ICD-10-CM | POA: Diagnosis not present

## 2019-07-15 DIAGNOSIS — M25632 Stiffness of left wrist, not elsewhere classified: Secondary | ICD-10-CM | POA: Diagnosis not present

## 2019-07-21 DIAGNOSIS — M25642 Stiffness of left hand, not elsewhere classified: Secondary | ICD-10-CM | POA: Diagnosis not present

## 2019-07-22 DIAGNOSIS — H40053 Ocular hypertension, bilateral: Secondary | ICD-10-CM | POA: Diagnosis not present

## 2019-07-22 DIAGNOSIS — H40013 Open angle with borderline findings, low risk, bilateral: Secondary | ICD-10-CM | POA: Diagnosis not present

## 2019-07-29 DIAGNOSIS — M25642 Stiffness of left hand, not elsewhere classified: Secondary | ICD-10-CM | POA: Diagnosis not present

## 2019-08-05 DIAGNOSIS — M25642 Stiffness of left hand, not elsewhere classified: Secondary | ICD-10-CM | POA: Diagnosis not present

## 2019-08-13 DIAGNOSIS — M25649 Stiffness of unspecified hand, not elsewhere classified: Secondary | ICD-10-CM | POA: Diagnosis not present

## 2019-08-13 DIAGNOSIS — S66022A Laceration of long flexor muscle, fascia and tendon of left thumb at wrist and hand level, initial encounter: Secondary | ICD-10-CM | POA: Diagnosis not present

## 2019-08-13 DIAGNOSIS — M25642 Stiffness of left hand, not elsewhere classified: Secondary | ICD-10-CM | POA: Diagnosis not present

## 2019-08-24 DIAGNOSIS — M25649 Stiffness of unspecified hand, not elsewhere classified: Secondary | ICD-10-CM | POA: Diagnosis not present

## 2019-08-24 DIAGNOSIS — M25642 Stiffness of left hand, not elsewhere classified: Secondary | ICD-10-CM | POA: Diagnosis not present

## 2019-08-31 DIAGNOSIS — M25649 Stiffness of unspecified hand, not elsewhere classified: Secondary | ICD-10-CM | POA: Diagnosis not present

## 2019-08-31 DIAGNOSIS — M25642 Stiffness of left hand, not elsewhere classified: Secondary | ICD-10-CM | POA: Diagnosis not present

## 2019-09-07 DIAGNOSIS — M25649 Stiffness of unspecified hand, not elsewhere classified: Secondary | ICD-10-CM | POA: Diagnosis not present

## 2019-09-07 DIAGNOSIS — M25642 Stiffness of left hand, not elsewhere classified: Secondary | ICD-10-CM | POA: Diagnosis not present

## 2019-09-13 DIAGNOSIS — S66022A Laceration of long flexor muscle, fascia and tendon of left thumb at wrist and hand level, initial encounter: Secondary | ICD-10-CM | POA: Diagnosis not present

## 2019-09-15 DIAGNOSIS — M25649 Stiffness of unspecified hand, not elsewhere classified: Secondary | ICD-10-CM | POA: Diagnosis not present

## 2019-09-15 DIAGNOSIS — S66022A Laceration of long flexor muscle, fascia and tendon of left thumb at wrist and hand level, initial encounter: Secondary | ICD-10-CM | POA: Diagnosis not present

## 2019-09-15 DIAGNOSIS — M25642 Stiffness of left hand, not elsewhere classified: Secondary | ICD-10-CM | POA: Diagnosis not present

## 2019-10-29 DIAGNOSIS — H40023 Open angle with borderline findings, high risk, bilateral: Secondary | ICD-10-CM | POA: Diagnosis not present

## 2019-11-23 DIAGNOSIS — R7309 Other abnormal glucose: Secondary | ICD-10-CM | POA: Diagnosis not present

## 2019-11-23 DIAGNOSIS — Z1389 Encounter for screening for other disorder: Secondary | ICD-10-CM | POA: Diagnosis not present

## 2019-11-23 DIAGNOSIS — M8580 Other specified disorders of bone density and structure, unspecified site: Secondary | ICD-10-CM | POA: Diagnosis not present

## 2019-11-23 DIAGNOSIS — E559 Vitamin D deficiency, unspecified: Secondary | ICD-10-CM | POA: Diagnosis not present

## 2019-11-23 DIAGNOSIS — E7800 Pure hypercholesterolemia, unspecified: Secondary | ICD-10-CM | POA: Diagnosis not present

## 2019-11-23 DIAGNOSIS — Z8546 Personal history of malignant neoplasm of prostate: Secondary | ICD-10-CM | POA: Diagnosis not present

## 2019-11-23 DIAGNOSIS — Z0000 Encounter for general adult medical examination without abnormal findings: Secondary | ICD-10-CM | POA: Diagnosis not present

## 2019-11-23 DIAGNOSIS — I10 Essential (primary) hypertension: Secondary | ICD-10-CM | POA: Diagnosis not present

## 2019-11-25 DIAGNOSIS — I10 Essential (primary) hypertension: Secondary | ICD-10-CM | POA: Diagnosis not present

## 2019-11-25 DIAGNOSIS — Z8546 Personal history of malignant neoplasm of prostate: Secondary | ICD-10-CM | POA: Diagnosis not present

## 2019-11-25 DIAGNOSIS — E559 Vitamin D deficiency, unspecified: Secondary | ICD-10-CM | POA: Diagnosis not present

## 2019-11-25 DIAGNOSIS — E7800 Pure hypercholesterolemia, unspecified: Secondary | ICD-10-CM | POA: Diagnosis not present

## 2020-03-02 DIAGNOSIS — H40023 Open angle with borderline findings, high risk, bilateral: Secondary | ICD-10-CM | POA: Diagnosis not present

## 2020-05-04 DIAGNOSIS — R69 Illness, unspecified: Secondary | ICD-10-CM | POA: Diagnosis not present

## 2020-06-01 DIAGNOSIS — I10 Essential (primary) hypertension: Secondary | ICD-10-CM | POA: Diagnosis not present

## 2020-06-01 DIAGNOSIS — E559 Vitamin D deficiency, unspecified: Secondary | ICD-10-CM | POA: Diagnosis not present

## 2020-06-01 DIAGNOSIS — R7309 Other abnormal glucose: Secondary | ICD-10-CM | POA: Diagnosis not present

## 2020-06-01 DIAGNOSIS — M8580 Other specified disorders of bone density and structure, unspecified site: Secondary | ICD-10-CM | POA: Diagnosis not present

## 2020-06-01 DIAGNOSIS — Z8546 Personal history of malignant neoplasm of prostate: Secondary | ICD-10-CM | POA: Diagnosis not present

## 2020-06-01 DIAGNOSIS — E7800 Pure hypercholesterolemia, unspecified: Secondary | ICD-10-CM | POA: Diagnosis not present

## 2020-07-06 DIAGNOSIS — H40023 Open angle with borderline findings, high risk, bilateral: Secondary | ICD-10-CM | POA: Diagnosis not present

## 2020-07-06 DIAGNOSIS — Z0100 Encounter for examination of eyes and vision without abnormal findings: Secondary | ICD-10-CM | POA: Diagnosis not present

## 2020-10-12 IMAGING — CR LEFT THUMB 2+V
3 series · 3 of 3 positions shown · non-contrast
Comparison: None.

CLINICAL DATA: Table saw laceration this morning.

EXAM:
LEFT THUMB 2+V

[x finger pa left]
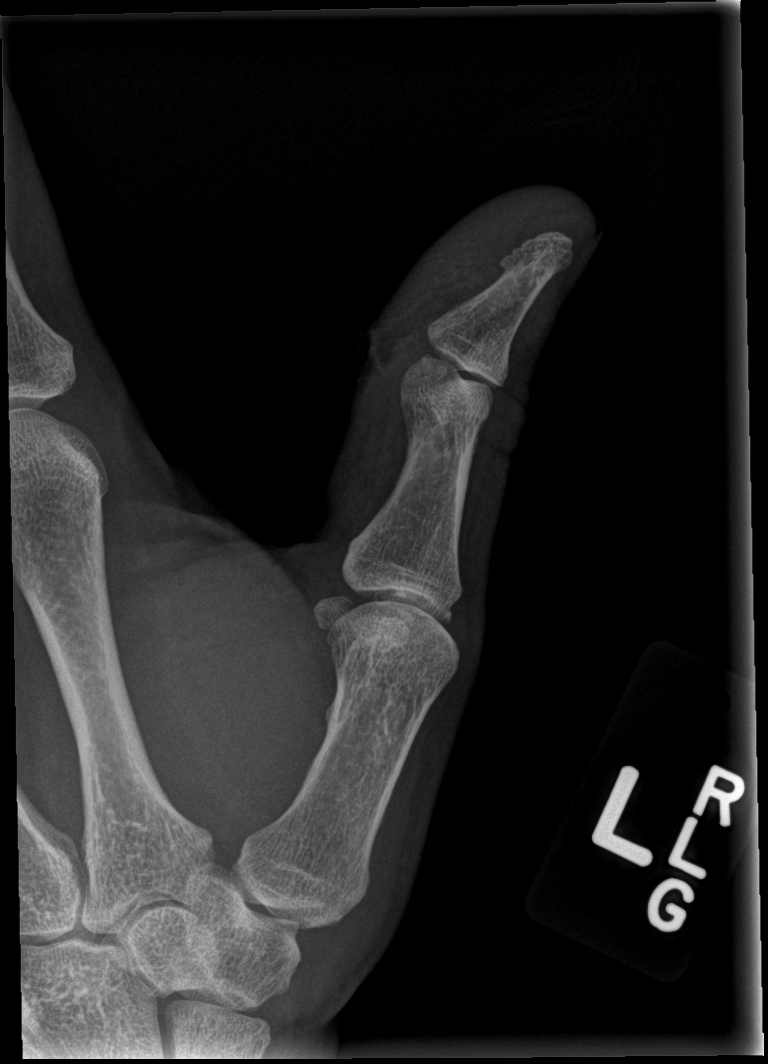

[x finger obl left]
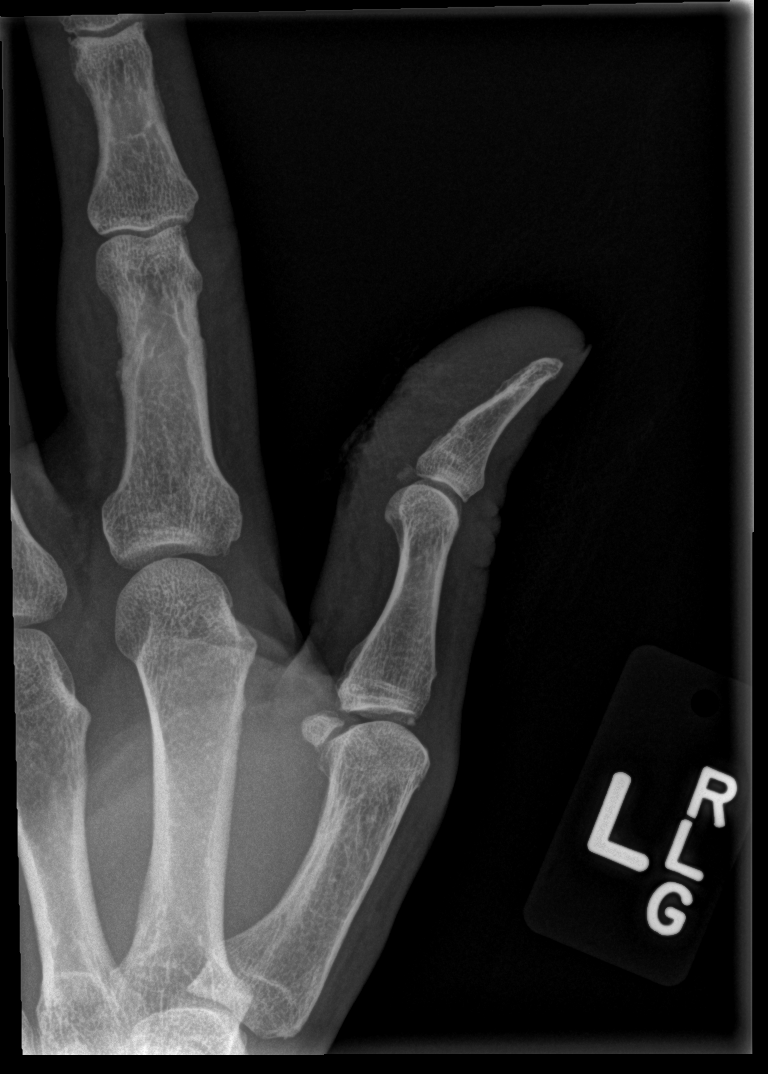

[x finger lat left]
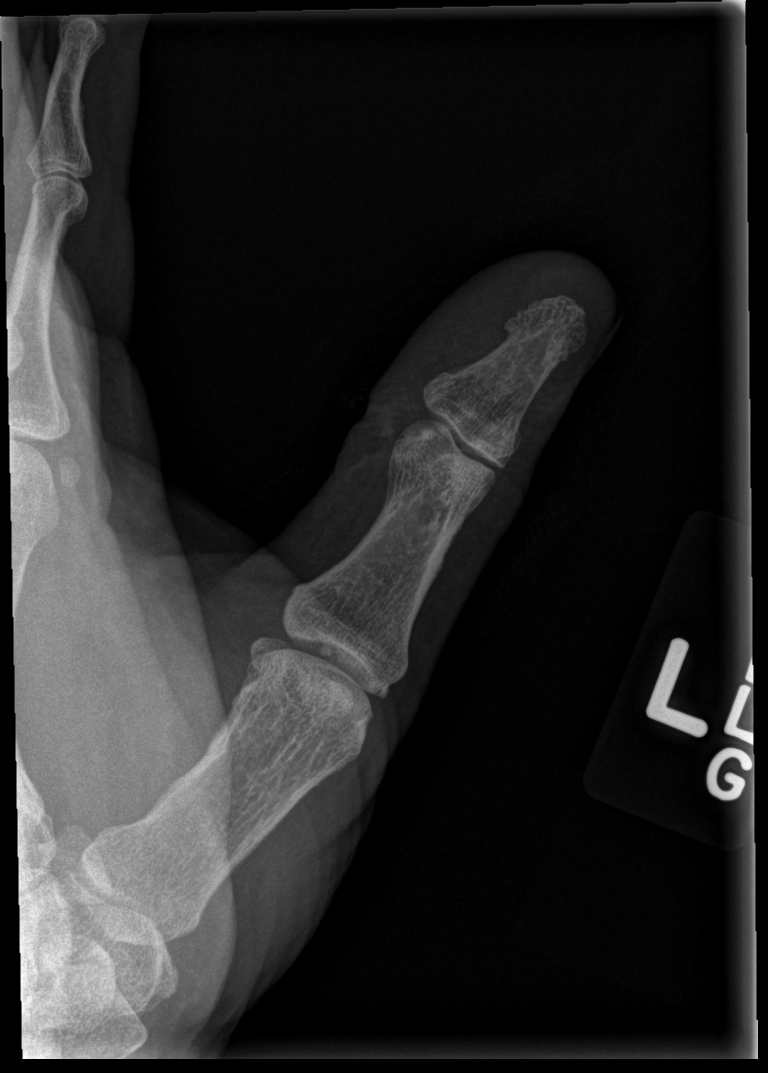

[3 of 3 positions shown; findings below may reference images not displayed]

FINDINGS: There is a soft tissue defect/wound involving the palmar aspect of
the thumb near the interphalangeal joint. No definite fracture or
radiopaque foreign body is identified.
IMPRESSION: No acute bony findings or radiopaque foreign body.

## 2020-10-16 DIAGNOSIS — H40023 Open angle with borderline findings, high risk, bilateral: Secondary | ICD-10-CM | POA: Diagnosis not present

## 2020-10-30 DIAGNOSIS — R69 Illness, unspecified: Secondary | ICD-10-CM | POA: Diagnosis not present

## 2020-11-21 DIAGNOSIS — R69 Illness, unspecified: Secondary | ICD-10-CM | POA: Diagnosis not present

## 2020-11-23 ENCOUNTER — Other Ambulatory Visit: Payer: Self-pay | Admitting: Family Medicine

## 2020-11-23 DIAGNOSIS — M8588 Other specified disorders of bone density and structure, other site: Secondary | ICD-10-CM

## 2020-11-23 DIAGNOSIS — M8580 Other specified disorders of bone density and structure, unspecified site: Secondary | ICD-10-CM | POA: Diagnosis not present

## 2020-11-23 DIAGNOSIS — Z0000 Encounter for general adult medical examination without abnormal findings: Secondary | ICD-10-CM | POA: Diagnosis not present

## 2020-11-23 DIAGNOSIS — Z8546 Personal history of malignant neoplasm of prostate: Secondary | ICD-10-CM | POA: Diagnosis not present

## 2020-11-23 DIAGNOSIS — Z1389 Encounter for screening for other disorder: Secondary | ICD-10-CM | POA: Diagnosis not present

## 2020-11-23 DIAGNOSIS — E7800 Pure hypercholesterolemia, unspecified: Secondary | ICD-10-CM | POA: Diagnosis not present

## 2020-11-23 DIAGNOSIS — E559 Vitamin D deficiency, unspecified: Secondary | ICD-10-CM | POA: Diagnosis not present

## 2020-11-23 DIAGNOSIS — I10 Essential (primary) hypertension: Secondary | ICD-10-CM | POA: Diagnosis not present

## 2020-11-23 DIAGNOSIS — Z86010 Personal history of colonic polyps: Secondary | ICD-10-CM | POA: Diagnosis not present

## 2020-11-23 DIAGNOSIS — R7309 Other abnormal glucose: Secondary | ICD-10-CM | POA: Diagnosis not present

## 2021-02-26 DIAGNOSIS — H40023 Open angle with borderline findings, high risk, bilateral: Secondary | ICD-10-CM | POA: Diagnosis not present

## 2021-02-28 ENCOUNTER — Ambulatory Visit
Admission: RE | Admit: 2021-02-28 | Discharge: 2021-02-28 | Disposition: A | Discharge status: Home / Self Care | Payer: Medicare HMO | Source: Ambulatory Visit | Attending: Family Medicine | Admitting: Family Medicine

## 2021-02-28 ENCOUNTER — Other Ambulatory Visit: Payer: Self-pay

## 2021-02-28 DIAGNOSIS — M8588 Other specified disorders of bone density and structure, other site: Secondary | ICD-10-CM

## 2021-02-28 DIAGNOSIS — M85851 Other specified disorders of bone density and structure, right thigh: Secondary | ICD-10-CM | POA: Diagnosis not present

## 2021-03-28 DIAGNOSIS — R109 Unspecified abdominal pain: Secondary | ICD-10-CM | POA: Diagnosis not present

## 2021-03-29 DIAGNOSIS — D124 Benign neoplasm of descending colon: Secondary | ICD-10-CM | POA: Diagnosis not present

## 2021-03-29 DIAGNOSIS — Z86010 Personal history of colonic polyps: Secondary | ICD-10-CM | POA: Diagnosis not present

## 2021-03-29 DIAGNOSIS — K635 Polyp of colon: Secondary | ICD-10-CM | POA: Diagnosis not present

## 2021-03-29 DIAGNOSIS — K640 First degree hemorrhoids: Secondary | ICD-10-CM | POA: Diagnosis not present

## 2021-03-29 DIAGNOSIS — K5730 Diverticulosis of large intestine without perforation or abscess without bleeding: Secondary | ICD-10-CM | POA: Diagnosis not present

## 2021-04-03 DIAGNOSIS — D124 Benign neoplasm of descending colon: Secondary | ICD-10-CM | POA: Diagnosis not present

## 2021-04-03 DIAGNOSIS — N201 Calculus of ureter: Secondary | ICD-10-CM | POA: Diagnosis not present

## 2021-04-03 DIAGNOSIS — K635 Polyp of colon: Secondary | ICD-10-CM | POA: Diagnosis not present

## 2021-04-03 DIAGNOSIS — R109 Unspecified abdominal pain: Secondary | ICD-10-CM | POA: Diagnosis not present

## 2021-04-03 DIAGNOSIS — N132 Hydronephrosis with renal and ureteral calculous obstruction: Secondary | ICD-10-CM | POA: Diagnosis not present

## 2021-06-25 DIAGNOSIS — H40023 Open angle with borderline findings, high risk, bilateral: Secondary | ICD-10-CM | POA: Diagnosis not present

## 2021-07-16 DIAGNOSIS — I10 Essential (primary) hypertension: Secondary | ICD-10-CM | POA: Diagnosis not present

## 2021-07-16 DIAGNOSIS — Z86010 Personal history of colonic polyps: Secondary | ICD-10-CM | POA: Diagnosis not present

## 2021-07-16 DIAGNOSIS — Z8546 Personal history of malignant neoplasm of prostate: Secondary | ICD-10-CM | POA: Diagnosis not present

## 2021-07-16 DIAGNOSIS — E7800 Pure hypercholesterolemia, unspecified: Secondary | ICD-10-CM | POA: Diagnosis not present

## 2021-07-16 DIAGNOSIS — R6889 Other general symptoms and signs: Secondary | ICD-10-CM | POA: Diagnosis not present

## 2021-07-16 DIAGNOSIS — M8580 Other specified disorders of bone density and structure, unspecified site: Secondary | ICD-10-CM | POA: Diagnosis not present

## 2021-07-16 DIAGNOSIS — E559 Vitamin D deficiency, unspecified: Secondary | ICD-10-CM | POA: Diagnosis not present

## 2021-07-16 DIAGNOSIS — R7309 Other abnormal glucose: Secondary | ICD-10-CM | POA: Diagnosis not present

## 2021-08-07 ENCOUNTER — Ambulatory Visit (INDEPENDENT_AMBULATORY_CARE_PROVIDER_SITE_OTHER): Payer: Medicare HMO | Admitting: Otolaryngology

## 2021-08-07 ENCOUNTER — Other Ambulatory Visit: Payer: Self-pay

## 2021-08-07 DIAGNOSIS — J351 Hypertrophy of tonsils: Secondary | ICD-10-CM

## 2021-08-07 NOTE — Progress Notes (Signed)
HPI: Lawrence Finley is a 70 y.o. male who presents is referred by his PCP Dr. Marisue Humble for evaluation of abnormality noted by patient's dentist when he was getting a dental implant.  Apparently this was over a month ago when he was seen his dentist and he noted some abnormal swelling in his throat and recommended having this checked.  He denies any sore throat or trouble swallowing. He does not smoke.Marland Kitchen  Past Medical History:  Diagnosis Date   At risk for sleep apnea    STOP-BANG= 6   SENT TO PCP 12-21-2013   Cancer [facility])    prostate cancer-surgery   History of kidney stones 10/2014   History of melanoma excision    History of prostate cancer    T1c , Gleason 6 --  s/p  radical prostatectomy   Hypertension    Melanoma ([place])    left arm>25 yrs ago   Right ureteral stone    Vitamin D deficiency    Wears glasses    Past Surgical History:  Procedure Laterality Date   CYSTOSCOPY WITH RETROGRADE PYELOGRAM, URETEROSCOPY AND STENT PLACEMENT Right 12/26/2014   Procedure: CYSTOSCOPY WITH RETROGRADE PYELOGRAM, URETEROSCOPY, STONE EXTRACTION,  AND STENT PLACEMENT;  Surgeon: Bernestine Amass, MD;  Location: [facility];  Service: Urology;  Laterality: Right;   EXTRACORPOREAL SHOCK WAVE LITHOTRIPSY Right 10-17-2014   HOLMIUM LASER APPLICATION Right [id number]   Procedure: HOLMIUM LASER APPLICATION;  Surgeon: Bernestine Amass, MD;  Location[facility];  Service: Urology;  Laterality: Right;   NERVE AND TENDON REPAIR Left 05/25/2019   Procedure: LEFT THUMB EXPLORATION WITH NERVE AND TENDON  REPAIR, DEBRIDEMENT OF FRACUTRE;  Surgeon: Leanora Cover, MD;  Location: [place];  Service: Orthopedics;  Laterality: Left;   ROBOT ASSISTED LAPAROSCOPIC RADICAL PROSTATECTOMY  08/ 2010   WRIST GANGLION EXCISION  1980's   Social History   Socioeconomic History   Marital status: Married    Spouse name: Not on file   Number of children: Not on file   Years of  education: Not on file   Highest education level: Not on file  Occupational History   Not on file  Tobacco Use   Smoking status: Never   Smokeless tobacco: Never  Vaping Use   Vaping Use: Never used  Substance and Sexual Activity   Alcohol use: Yes    Comment: occasionally   Drug use: No   Sexual activity: Not on file  Other Topics Concern   Not on file  Social History Narrative   Not on file   Social Determinants of Health   Financial Resource Strain: Not on file  Food Insecurity: Not on file  Transportation Needs: Not on file  Physical Activity: Not on file  Stress: Not on file  Social Connections: Not on file   No family history on file. No Known Allergies Prior to Admission medications   Medication Sig Start Date End Date Taking? Authorizing Provider  amLODipine (NORVASC) 10 MG tablet Take 10 mg by mouth at bedtime.    [provider]  aspirin EC 81 MG tablet Take 81 mg by mouth daily.    [provider]  HYDROcodone-acetaminophen (NORCO) 5-325 MG tablet 1-2 tabs po q6 hours prn pain 05/25/19   Leanora Cover, MD  ibuprofen (ADVIL) 200 MG tablet Take 200 mg by mouth every 6 (six) hours as needed.    [provider]  sulfamethoxazole-trimethoprim (BACTRIM DS) 800-160 MG tablet Take 1 tablet  by mouth 2 (two) times daily. 05/25/19   Leanora Cover, MD  Vitamin D, Ergocalciferol, (DRISDOL) 50000 UNITS CAPS capsule Take 50,000 Units by mouth every 7 (seven) days.    [provider]     Positive ROS: Otherwise negative  All other systems have been reviewed and were otherwise negative with the exception of those mentioned in the HPI and as above.  Physical Exam: Constitutional: Alert, well-appearing, no acute distress Ears: External ears without lesions or tenderness. Ear canals are clear bilaterally with intact, clear TMs.  Nasal: External nose without lesions. Septum with mild deformity and mild rhinitis.. Clear nasal passages  otherwise. Oral: Lips and gums without lesions. Tongue and palate mucosa without lesions. Posterior oropharynx clear.  Both tonsils are present with the right tonsil slightly larger than the left tonsil but this is soft to palpation and no evidence of neoplasm.  Indirect laryngoscopy revealed a clear base of tongue vallecula and epiglottis.  Vocal cords were clear bilaterally with normal vocal mobility.  Buccal mucosa and palate mucosa are clear. Neck: No palpable adenopathy or masses.  No palpable adenopathy on either side of the neck. Respiratory: Breathing comfortably  Skin: No facial/neck lesions or rash noted.  Procedures  Assessment: I think the abnormality that the dentist noticed with the patient's tonsil which is little bit more prominent on the right side but this is benign on exam with no evidence of neoplasm.  Plan: Reassured patient of normal oral and tonsillar exam with no evidence of neoplasm.   Radene Journey, MD   CC:

## 2021-08-08 DIAGNOSIS — Z809 Family history of malignant neoplasm, unspecified: Secondary | ICD-10-CM | POA: Diagnosis not present

## 2021-08-08 DIAGNOSIS — I10 Essential (primary) hypertension: Secondary | ICD-10-CM | POA: Diagnosis not present

## 2021-08-08 DIAGNOSIS — Z7982 Long term (current) use of aspirin: Secondary | ICD-10-CM | POA: Diagnosis not present

## 2021-08-08 DIAGNOSIS — Z8546 Personal history of malignant neoplasm of prostate: Secondary | ICD-10-CM | POA: Diagnosis not present

## 2021-08-08 DIAGNOSIS — Z8249 Family history of ischemic heart disease and other diseases of the circulatory system: Secondary | ICD-10-CM | POA: Diagnosis not present

## 2021-11-20 DIAGNOSIS — H40023 Open angle with borderline findings, high risk, bilateral: Secondary | ICD-10-CM | POA: Diagnosis not present

## 2022-01-22 DIAGNOSIS — Z1389 Encounter for screening for other disorder: Secondary | ICD-10-CM | POA: Diagnosis not present

## 2022-01-22 DIAGNOSIS — E559 Vitamin D deficiency, unspecified: Secondary | ICD-10-CM | POA: Diagnosis not present

## 2022-01-22 DIAGNOSIS — M8580 Other specified disorders of bone density and structure, unspecified site: Secondary | ICD-10-CM | POA: Diagnosis not present

## 2022-01-22 DIAGNOSIS — Z1331 Encounter for screening for depression: Secondary | ICD-10-CM | POA: Diagnosis not present

## 2022-01-22 DIAGNOSIS — Z86010 Personal history of colonic polyps: Secondary | ICD-10-CM | POA: Diagnosis not present

## 2022-01-22 DIAGNOSIS — R7309 Other abnormal glucose: Secondary | ICD-10-CM | POA: Diagnosis not present

## 2022-01-22 DIAGNOSIS — I10 Essential (primary) hypertension: Secondary | ICD-10-CM | POA: Diagnosis not present

## 2022-01-22 DIAGNOSIS — E7800 Pure hypercholesterolemia, unspecified: Secondary | ICD-10-CM | POA: Diagnosis not present

## 2022-01-22 DIAGNOSIS — Z8546 Personal history of malignant neoplasm of prostate: Secondary | ICD-10-CM | POA: Diagnosis not present

## 2022-01-22 DIAGNOSIS — Z0000 Encounter for general adult medical examination without abnormal findings: Secondary | ICD-10-CM | POA: Diagnosis not present

## 2022-04-02 DIAGNOSIS — H40023 Open angle with borderline findings, high risk, bilateral: Secondary | ICD-10-CM | POA: Diagnosis not present

## 2022-07-22 DIAGNOSIS — E559 Vitamin D deficiency, unspecified: Secondary | ICD-10-CM | POA: Diagnosis not present

## 2022-07-22 DIAGNOSIS — I10 Essential (primary) hypertension: Secondary | ICD-10-CM | POA: Diagnosis not present

## 2022-07-22 DIAGNOSIS — Z86010 Personal history of colonic polyps: Secondary | ICD-10-CM | POA: Diagnosis not present

## 2022-07-22 DIAGNOSIS — Z8546 Personal history of malignant neoplasm of prostate: Secondary | ICD-10-CM | POA: Diagnosis not present

## 2022-07-22 DIAGNOSIS — R7309 Other abnormal glucose: Secondary | ICD-10-CM | POA: Diagnosis not present

## 2022-07-22 DIAGNOSIS — M8580 Other specified disorders of bone density and structure, unspecified site: Secondary | ICD-10-CM | POA: Diagnosis not present

## 2022-07-22 DIAGNOSIS — E7800 Pure hypercholesterolemia, unspecified: Secondary | ICD-10-CM | POA: Diagnosis not present

## 2022-08-02 DIAGNOSIS — H40023 Open angle with borderline findings, high risk, bilateral: Secondary | ICD-10-CM | POA: Diagnosis not present

## 2022-08-30 DIAGNOSIS — D229 Melanocytic nevi, unspecified: Secondary | ICD-10-CM | POA: Diagnosis not present

## 2022-09-04 DIAGNOSIS — L821 Other seborrheic keratosis: Secondary | ICD-10-CM | POA: Diagnosis not present

## 2022-09-04 DIAGNOSIS — L814 Other melanin hyperpigmentation: Secondary | ICD-10-CM | POA: Diagnosis not present

## 2022-09-04 DIAGNOSIS — D229 Melanocytic nevi, unspecified: Secondary | ICD-10-CM | POA: Diagnosis not present

## 2023-02-03 DIAGNOSIS — H40053 Ocular hypertension, bilateral: Secondary | ICD-10-CM | POA: Diagnosis not present

## 2023-02-03 DIAGNOSIS — H40023 Open angle with borderline findings, high risk, bilateral: Secondary | ICD-10-CM | POA: Diagnosis not present

## 2023-02-04 DIAGNOSIS — M8580 Other specified disorders of bone density and structure, unspecified site: Secondary | ICD-10-CM | POA: Diagnosis not present

## 2023-02-04 DIAGNOSIS — Z1331 Encounter for screening for depression: Secondary | ICD-10-CM | POA: Diagnosis not present

## 2023-02-04 DIAGNOSIS — E7800 Pure hypercholesterolemia, unspecified: Secondary | ICD-10-CM | POA: Diagnosis not present

## 2023-02-04 DIAGNOSIS — I10 Essential (primary) hypertension: Secondary | ICD-10-CM | POA: Diagnosis not present

## 2023-02-04 DIAGNOSIS — Z8546 Personal history of malignant neoplasm of prostate: Secondary | ICD-10-CM | POA: Diagnosis not present

## 2023-02-04 DIAGNOSIS — Z0000 Encounter for general adult medical examination without abnormal findings: Secondary | ICD-10-CM | POA: Diagnosis not present

## 2023-02-04 DIAGNOSIS — E559 Vitamin D deficiency, unspecified: Secondary | ICD-10-CM | POA: Diagnosis not present

## 2023-02-04 DIAGNOSIS — Z86010 Personal history of colonic polyps: Secondary | ICD-10-CM | POA: Diagnosis not present

## 2023-06-05 DIAGNOSIS — H40023 Open angle with borderline findings, high risk, bilateral: Secondary | ICD-10-CM | POA: Diagnosis not present

## 2023-08-07 DIAGNOSIS — M25462 Effusion, left knee: Secondary | ICD-10-CM | POA: Diagnosis not present

## 2023-08-07 DIAGNOSIS — M25562 Pain in left knee: Secondary | ICD-10-CM | POA: Diagnosis not present

## 2023-08-21 DIAGNOSIS — M25562 Pain in left knee: Secondary | ICD-10-CM | POA: Diagnosis not present

## 2023-08-21 DIAGNOSIS — M6281 Muscle weakness (generalized): Secondary | ICD-10-CM | POA: Diagnosis not present

## 2023-08-21 DIAGNOSIS — M25462 Effusion, left knee: Secondary | ICD-10-CM | POA: Diagnosis not present

## 2023-08-27 DIAGNOSIS — M25562 Pain in left knee: Secondary | ICD-10-CM | POA: Diagnosis not present

## 2023-08-27 DIAGNOSIS — M6281 Muscle weakness (generalized): Secondary | ICD-10-CM | POA: Diagnosis not present

## 2023-08-27 DIAGNOSIS — M25462 Effusion, left knee: Secondary | ICD-10-CM | POA: Diagnosis not present

## 2023-09-03 DIAGNOSIS — M25462 Effusion, left knee: Secondary | ICD-10-CM | POA: Diagnosis not present

## 2023-09-03 DIAGNOSIS — M6281 Muscle weakness (generalized): Secondary | ICD-10-CM | POA: Diagnosis not present

## 2023-09-03 DIAGNOSIS — M25562 Pain in left knee: Secondary | ICD-10-CM | POA: Diagnosis not present

## 2023-09-08 DIAGNOSIS — R269 Unspecified abnormalities of gait and mobility: Secondary | ICD-10-CM | POA: Diagnosis not present

## 2023-09-08 DIAGNOSIS — G252 Other specified forms of tremor: Secondary | ICD-10-CM | POA: Diagnosis not present

## 2023-09-08 DIAGNOSIS — I10 Essential (primary) hypertension: Secondary | ICD-10-CM | POA: Diagnosis not present

## 2023-09-18 DIAGNOSIS — M25562 Pain in left knee: Secondary | ICD-10-CM | POA: Diagnosis not present

## 2023-10-06 DIAGNOSIS — H40023 Open angle with borderline findings, high risk, bilateral: Secondary | ICD-10-CM | POA: Diagnosis not present

## 2024-02-09 DIAGNOSIS — H40023 Open angle with borderline findings, high risk, bilateral: Secondary | ICD-10-CM | POA: Diagnosis not present

## 2024-02-19 DIAGNOSIS — Z1330 Encounter for screening examination for mental health and behavioral disorders, unspecified: Secondary | ICD-10-CM | POA: Diagnosis not present

## 2024-02-19 DIAGNOSIS — R251 Tremor, unspecified: Secondary | ICD-10-CM | POA: Diagnosis not present

## 2024-02-19 DIAGNOSIS — G20C Parkinsonism, unspecified: Secondary | ICD-10-CM | POA: Diagnosis not present

## 2024-03-09 ENCOUNTER — Other Ambulatory Visit: Payer: Self-pay | Admitting: Family Medicine

## 2024-03-09 DIAGNOSIS — M8580 Other specified disorders of bone density and structure, unspecified site: Secondary | ICD-10-CM

## 2024-03-31 DIAGNOSIS — R251 Tremor, unspecified: Secondary | ICD-10-CM | POA: Diagnosis not present

## 2024-03-31 DIAGNOSIS — G20C Parkinsonism, unspecified: Secondary | ICD-10-CM | POA: Diagnosis not present

## 2024-05-27 DIAGNOSIS — L814 Other melanin hyperpigmentation: Secondary | ICD-10-CM | POA: Diagnosis not present

## 2024-05-27 DIAGNOSIS — L738 Other specified follicular disorders: Secondary | ICD-10-CM | POA: Diagnosis not present

## 2024-05-27 DIAGNOSIS — L219 Seborrheic dermatitis, unspecified: Secondary | ICD-10-CM | POA: Diagnosis not present

## 2024-05-27 DIAGNOSIS — C44712 Basal cell carcinoma of skin of right lower limb, including hip: Secondary | ICD-10-CM | POA: Diagnosis not present

## 2024-05-27 DIAGNOSIS — L821 Other seborrheic keratosis: Secondary | ICD-10-CM | POA: Diagnosis not present

## 2024-05-27 DIAGNOSIS — D229 Melanocytic nevi, unspecified: Secondary | ICD-10-CM | POA: Diagnosis not present

## 2024-06-28 DIAGNOSIS — C44712 Basal cell carcinoma of skin of right lower limb, including hip: Secondary | ICD-10-CM | POA: Diagnosis not present

## 2024-07-26 ENCOUNTER — Other Ambulatory Visit (HOSPITAL_BASED_OUTPATIENT_CLINIC_OR_DEPARTMENT_OTHER)

## 2024-07-26 ENCOUNTER — Ambulatory Visit (HOSPITAL_BASED_OUTPATIENT_CLINIC_OR_DEPARTMENT_OTHER)
Admission: RE | Admit: 2024-07-26 | Discharge: 2024-07-26 | Disposition: A | Discharge status: Home / Self Care | Source: Ambulatory Visit | Attending: Family Medicine | Admitting: Family Medicine

## 2024-07-26 DIAGNOSIS — M8580 Other specified disorders of bone density and structure, unspecified site: Secondary | ICD-10-CM | POA: Diagnosis not present

## 2024-07-26 DIAGNOSIS — Z13820 Encounter for screening for osteoporosis: Secondary | ICD-10-CM | POA: Insufficient documentation

## 2024-07-26 DIAGNOSIS — M8589 Other specified disorders of bone density and structure, multiple sites: Secondary | ICD-10-CM | POA: Insufficient documentation

## 2024-07-26 DIAGNOSIS — H40023 Open angle with borderline findings, high risk, bilateral: Secondary | ICD-10-CM | POA: Diagnosis not present

## 2024-08-02 DIAGNOSIS — R251 Tremor, unspecified: Secondary | ICD-10-CM | POA: Diagnosis not present

## 2024-08-02 DIAGNOSIS — G20C Parkinsonism, unspecified: Secondary | ICD-10-CM | POA: Diagnosis not present

## 2024-08-02 DIAGNOSIS — Z79899 Other long term (current) drug therapy: Secondary | ICD-10-CM | POA: Diagnosis not present

## 2024-09-27 DIAGNOSIS — M720 Palmar fascial fibromatosis [Dupuytren]: Secondary | ICD-10-CM | POA: Diagnosis not present

## 2024-09-27 DIAGNOSIS — M79642 Pain in left hand: Secondary | ICD-10-CM | POA: Diagnosis not present

## 2024-10-21 DIAGNOSIS — M720 Palmar fascial fibromatosis [Dupuytren]: Secondary | ICD-10-CM | POA: Diagnosis not present

## 2024-10-25 DIAGNOSIS — H40023 Open angle with borderline findings, high risk, bilateral: Secondary | ICD-10-CM | POA: Diagnosis not present

## 2024-11-15 ENCOUNTER — Other Ambulatory Visit

## 2024-11-25 DIAGNOSIS — G20C Parkinsonism, unspecified: Secondary | ICD-10-CM | POA: Diagnosis not present
